# Patient Record
Sex: Male | Born: 1945
Health system: Southern US, Community
[De-identification: ages and names within clinical notes are randomized; demographics above are authoritative.]

## PROBLEM LIST (undated history)

## (undated) DIAGNOSIS — I10 Essential (primary) hypertension: Secondary | ICD-10-CM

## (undated) DIAGNOSIS — N400 Enlarged prostate without lower urinary tract symptoms: Secondary | ICD-10-CM

## (undated) DIAGNOSIS — E7800 Pure hypercholesterolemia, unspecified: Secondary | ICD-10-CM

## (undated) DIAGNOSIS — K30 Functional dyspepsia: Secondary | ICD-10-CM

## (undated) HISTORY — PX: KNEE ARTHROSCOPY: SUR90

## (undated) HISTORY — PX: ROTATOR CUFF REPAIR: SHX139

## (undated) HISTORY — PX: HERNIA REPAIR: SHX51

## (undated) HISTORY — PX: COLONOSCOPY: SHX174

## (undated) HISTORY — DX: Benign prostatic hyperplasia without lower urinary tract symptoms: N40.0

## (undated) HISTORY — PX: NASAL SINUS SURGERY: SHX719

## (undated) HISTORY — PX: CATARACT EXTRACTION: SUR2

---

## 2000-10-18 ENCOUNTER — Encounter: Admission: RE | Admit: 2000-10-18 | Discharge: 2001-01-16 | Payer: Self-pay | Admitting: Family Medicine

## 2010-12-14 ENCOUNTER — Encounter (HOSPITAL_COMMUNITY): Payer: Self-pay | Admitting: Pharmacy Technician

## 2010-12-16 ENCOUNTER — Inpatient Hospital Stay (HOSPITAL_COMMUNITY)
Admission: RE | Admit: 2010-12-16 | Payer: BC Managed Care – PPO | Source: Ambulatory Visit | Admitting: Orthopedic Surgery

## 2010-12-16 ENCOUNTER — Encounter (HOSPITAL_COMMUNITY): Admission: RE | Payer: Self-pay | Source: Ambulatory Visit

## 2010-12-16 SURGERY — POSTERIOR LUMBAR FUSION 1 LEVEL
Anesthesia: General | Laterality: Right

## 2010-12-17 ENCOUNTER — Other Ambulatory Visit: Payer: Self-pay | Admitting: Orthopedic Surgery

## 2010-12-24 ENCOUNTER — Inpatient Hospital Stay (HOSPITAL_COMMUNITY): Admission: RE | Admit: 2010-12-24 | Payer: BC Managed Care – PPO | Source: Ambulatory Visit

## 2010-12-28 ENCOUNTER — Encounter (HOSPITAL_COMMUNITY): Payer: Self-pay

## 2010-12-28 ENCOUNTER — Other Ambulatory Visit: Payer: Self-pay

## 2010-12-28 ENCOUNTER — Encounter (HOSPITAL_COMMUNITY)
Admission: RE | Admit: 2010-12-28 | Discharge: 2010-12-28 | Disposition: A | Discharge status: Home / Self Care | Payer: BC Managed Care – PPO | Source: Ambulatory Visit | Attending: Orthopedic Surgery | Admitting: Orthopedic Surgery

## 2010-12-28 HISTORY — DX: Pure hypercholesterolemia, unspecified: E78.00

## 2010-12-28 HISTORY — DX: Essential (primary) hypertension: I10

## 2010-12-28 LAB — PROTIME-INR: Prothrombin Time: 13 seconds (ref 11.6–15.2)

## 2010-12-28 LAB — URINALYSIS, ROUTINE W REFLEX MICROSCOPIC
Glucose, UA: NEGATIVE mg/dL
Ketones, ur: NEGATIVE mg/dL
Leukocytes, UA: NEGATIVE
Nitrite: NEGATIVE
Protein, ur: NEGATIVE mg/dL

## 2010-12-28 LAB — CBC
HCT: 43.7 % (ref 39.0–52.0)
Hemoglobin: 15.1 g/dL (ref 13.0–17.0)
MCH: 32.7 pg (ref 26.0–34.0)
MCV: 94.6 fL (ref 78.0–100.0)
RBC: 4.62 MIL/uL (ref 4.22–5.81)

## 2010-12-28 LAB — DIFFERENTIAL
Basophils Absolute: 0 10*3/uL (ref 0.0–0.1)
Basophils Relative: 0 % (ref 0–1)
Eosinophils Relative: 0 % (ref 0–5)
Lymphocytes Relative: 22 % (ref 12–46)
Neutro Abs: 5.1 10*3/uL (ref 1.7–7.7)

## 2010-12-28 LAB — COMPREHENSIVE METABOLIC PANEL
CO2: 28 mEq/L (ref 19–32)
Calcium: 10 mg/dL (ref 8.4–10.5)
Creatinine, Ser: 0.85 mg/dL (ref 0.50–1.35)
GFR calc Af Amer: >90 mL/min (ref >90)
GFR calc non Af Amer: >90 mL/min (ref >90)
Glucose, Bld: 92 mg/dL (ref 70–99)
Total Bilirubin: 0.4 mg/dL (ref 0.3–1.2)

## 2010-12-28 LAB — SURGICAL PCR SCREEN: MRSA, PCR: NEGATIVE

## 2010-12-28 LAB — TYPE AND SCREEN
ABO/RH(D): A POS
Antibody Screen: NEGATIVE

## 2010-12-28 LAB — ABO/RH: ABO/RH(D): A POS

## 2010-12-28 NOTE — Pre-Procedure Instructions (Signed)
[address]  12/28/2010   Your procedure is scheduled on:  12/31/10  Report to [facility] at 530 AM.  Call this number if you have problems the morning of surgery: [phone]   Remember:   Do not eat food:After Midnight.  May have clear liquids: up to 4 Hours before arrival.  Clear liquids include soda, tea, black coffee, apple or grape juice, broth.  Take these medicines the morning of surgery with A SIP OF WATER: norco   Do not wear jewelry, make-up or nail polish.  Do not wear lotions, powders, or perfumes. You may wear deodorant.  Do not shave 48 hours prior to surgery.  Do not bring valuables to the hospital.  Contacts, dentures or bridgework may not be worn into surgery.  Leave suitcase in the car. After surgery it may be brought to your room.  For patients admitted to the hospital, checkout time is 11:00 AM the day of discharge.   Patients discharged the day of surgery will not be allowed to drive home.  Name and phone number of your driver: family  Special Instructions: CHG Shower Use Special Wash: 1/2 bottle night before surgery and 1/2 bottle morning of surgery.   Please read over the following fact sheets that you were given: Coughing and Deep Breathing, Blood Transfusion Information, MRSA Information and Surgical Site Infection Prevention

## 2010-12-29 NOTE — Consult Note (Signed)
Anesthesia:  Patient is a 65 year old male scheduled for a right L5-S1 transforamenal lumbar fusion on 12/31/10.  His history includes HTN, HLD, and former smoker.  Preoperative CXR showed no acute disease.  Labs are acceptable.    I was asked to review his preoperative EKG showing NSR with minimal criteria for LVH, and cannot rule out anterior infarct, age undetermined.  He also has a low R wave and inverted T wave in lead III, but this is not apparent in the other inferior leads.  Currently there are no comparison EKGs available.  No PCP is listed.  He did not report any CV symptoms at his PAT appointment. He has no history of DM or CAD according to our records.  Clinical correlation on the day of surgery, but anticipate that if he remains asymptomatic that he can proceed.

## 2010-12-30 MED ORDER — POVIDONE-IODINE 7.5 % EX SOLN
Freq: Once | CUTANEOUS | Status: DC
  Filled 2010-12-30: qty 118

## 2010-12-30 MED ORDER — CEFAZOLIN SODIUM-DEXTROSE 2-3 GM-% IV SOLR
2.0000 g | INTRAVENOUS | Status: AC
  Administered 2010-12-31: 2 g via INTRAVENOUS
  Filled 2010-12-30 (×2): qty 50

## 2010-12-31 ENCOUNTER — Inpatient Hospital Stay (HOSPITAL_COMMUNITY)
Admission: RE | Admit: 2010-12-31 | Discharge: 2011-01-03 | DRG: 756 | Disposition: A | Discharge status: Home / Self Care | Payer: BC Managed Care – PPO | Source: Ambulatory Visit | Attending: Orthopedic Surgery | Admitting: Orthopedic Surgery

## 2010-12-31 ENCOUNTER — Inpatient Hospital Stay (HOSPITAL_COMMUNITY): Payer: BC Managed Care – PPO

## 2010-12-31 ENCOUNTER — Encounter (HOSPITAL_COMMUNITY)
Admission: RE | Disposition: A | Discharge status: Home / Self Care | Payer: Self-pay | Source: Ambulatory Visit | Attending: Orthopedic Surgery

## 2010-12-31 ENCOUNTER — Encounter (HOSPITAL_COMMUNITY): Payer: Self-pay | Admitting: Vascular Surgery

## 2010-12-31 ENCOUNTER — Inpatient Hospital Stay (HOSPITAL_COMMUNITY): Payer: BC Managed Care – PPO | Admitting: Vascular Surgery

## 2010-12-31 DIAGNOSIS — Z01812 Encounter for preprocedural laboratory examination: Secondary | ICD-10-CM

## 2010-12-31 DIAGNOSIS — M48061 Spinal stenosis, lumbar region without neurogenic claudication: Principal | ICD-10-CM | POA: Diagnosis present

## 2010-12-31 DIAGNOSIS — E7800 Pure hypercholesterolemia, unspecified: Secondary | ICD-10-CM | POA: Diagnosis present

## 2010-12-31 DIAGNOSIS — Z981 Arthrodesis status: Secondary | ICD-10-CM

## 2010-12-31 DIAGNOSIS — I10 Essential (primary) hypertension: Secondary | ICD-10-CM | POA: Diagnosis present

## 2010-12-31 DIAGNOSIS — M948X9 Other specified disorders of cartilage, unspecified sites: Secondary | ICD-10-CM | POA: Diagnosis present

## 2010-12-31 SURGERY — POSTERIOR LUMBAR FUSION 1 LEVEL
Anesthesia: General | Site: Spine Lumbar | Laterality: Right | Wound class: Clean

## 2010-12-31 MED ORDER — DROPERIDOL 2.5 MG/ML IJ SOLN: 0.6250 mg | INTRAMUSCULAR | Status: DC | PRN

## 2010-12-31 MED ORDER — HEMOSTATIC AGENTS (NO CHARGE) OPTIME
TOPICAL | Status: DC | PRN
  Administered 2010-12-31 (×2): 1 via TOPICAL

## 2010-12-31 MED ORDER — FENTANYL CITRATE 0.05 MG/ML IJ SOLN
INTRAMUSCULAR | Status: DC | PRN
  Administered 2010-12-31: 100 ug via INTRAVENOUS
  Administered 2010-12-31 (×2): 50 ug via INTRAVENOUS
  Administered 2010-12-31: 100 ug via INTRAVENOUS
  Administered 2010-12-31 (×4): 50 ug via INTRAVENOUS

## 2010-12-31 MED ORDER — PROPOFOL 10 MG/ML IV EMUL
INTRAVENOUS | Status: DC | PRN
  Administered 2010-12-31: 120 ug/kg/min via INTRAVENOUS

## 2010-12-31 MED ORDER — THROMBIN 20000 UNITS EX KIT
PACK | CUTANEOUS | Status: DC | PRN
  Administered 2010-12-31: 20000 [IU] via TOPICAL

## 2010-12-31 MED ORDER — ROCURONIUM BROMIDE 100 MG/10ML IV SOLN
INTRAVENOUS | Status: DC | PRN
  Administered 2010-12-31: 10 mg via INTRAVENOUS
  Administered 2010-12-31: 50 mg via INTRAVENOUS
  Administered 2010-12-31: 20 mg via INTRAVENOUS

## 2010-12-31 MED ORDER — PHENYLEPHRINE HCL 10 MG/ML IJ SOLN
INTRAMUSCULAR | Status: DC | PRN
  Administered 2010-12-31: 40 ug via INTRAVENOUS
  Administered 2010-12-31 (×2): 80 ug via INTRAVENOUS

## 2010-12-31 MED ORDER — SIMVASTATIN 10 MG PO TABS
10.0000 mg | ORAL_TABLET | Freq: Every day | ORAL | Status: DC
  Administered 2010-12-31 – 2011-01-02 (×3): 10 mg via ORAL
  Filled 2010-12-31 (×5): qty 1

## 2010-12-31 MED ORDER — SODIUM CHLORIDE 0.9 % IJ SOLN: 9.0000 mL | INTRAMUSCULAR | Status: DC | PRN

## 2010-12-31 MED ORDER — THROMBIN 20000 UNITS EX KIT
PACK | OROMUCOSAL | Status: DC | PRN
  Administered 2010-12-31: 09:00:00 via TOPICAL

## 2010-12-31 MED ORDER — SODIUM CHLORIDE 0.9 % IJ SOLN: 3.0000 mL | INTRAMUSCULAR | Status: DC | PRN

## 2010-12-31 MED ORDER — MIDAZOLAM HCL 5 MG/5ML IJ SOLN
INTRAMUSCULAR | Status: DC | PRN
  Administered 2010-12-31: 2 mg via INTRAVENOUS

## 2010-12-31 MED ORDER — LACTATED RINGERS IV SOLN
INTRAVENOUS | Status: DC | PRN
  Administered 2010-12-31 (×4): via INTRAVENOUS

## 2010-12-31 MED ORDER — CEFAZOLIN SODIUM 1-5 GM-% IV SOLN
1.0000 g | Freq: Three times a day (TID) | INTRAVENOUS | Status: AC
  Administered 2010-12-31 (×2): 1 g via INTRAVENOUS
  Filled 2010-12-31 (×2): qty 50

## 2010-12-31 MED ORDER — SODIUM CHLORIDE 0.9 % IV SOLN: 250.0000 mL | INTRAVENOUS | Status: DC

## 2010-12-31 MED ORDER — MUPIROCIN CALCIUM 2 % EX CREA: TOPICAL_CREAM | Freq: Two times a day (BID) | CUTANEOUS | Status: DC

## 2010-12-31 MED ORDER — DIPHENHYDRAMINE HCL 50 MG/ML IJ SOLN: 12.5000 mg | Freq: Four times a day (QID) | INTRAMUSCULAR | Status: DC | PRN

## 2010-12-31 MED ORDER — MUPIROCIN 2 % EX OINT
TOPICAL_OINTMENT | CUTANEOUS | Status: AC
  Filled 2010-12-31: qty 22

## 2010-12-31 MED ORDER — MENTHOL 3 MG MT LOZG: 1.0000 | LOZENGE | OROMUCOSAL | Status: DC | PRN

## 2010-12-31 MED ORDER — MORPHINE SULFATE (PF) 1 MG/ML IV SOLN: INTRAVENOUS | Status: DC

## 2010-12-31 MED ORDER — ACETAMINOPHEN 325 MG PO TABS
650.0000 mg | ORAL_TABLET | ORAL | Status: DC | PRN
  Administered 2010-12-31 – 2011-01-01 (×2): 650 mg via ORAL
  Filled 2010-12-31 (×2): qty 2

## 2010-12-31 MED ORDER — THERA M PLUS PO TABS
1.0000 | ORAL_TABLET | Freq: Every day | ORAL | Status: DC
  Administered 2010-12-31 – 2011-01-03 (×4): 1 via ORAL
  Filled 2010-12-31 (×4): qty 1

## 2010-12-31 MED ORDER — DIAZEPAM 5 MG PO TABS
5.0000 mg | ORAL_TABLET | Freq: Four times a day (QID) | ORAL | Status: DC | PRN
  Administered 2011-01-01 – 2011-01-02 (×2): 5 mg via ORAL
  Filled 2010-12-31 (×2): qty 1

## 2010-12-31 MED ORDER — MUPIROCIN 2 % EX OINT
TOPICAL_OINTMENT | Freq: Two times a day (BID) | CUTANEOUS | Status: DC
  Administered 2010-12-31 – 2011-01-02 (×6): via NASAL

## 2010-12-31 MED ORDER — PHENYLEPHRINE HCL 10 MG/ML IJ SOLN
10.0000 mg | INTRAVENOUS | Status: DC | PRN
  Administered 2010-12-31: 50 ug/min via INTRAVENOUS

## 2010-12-31 MED ORDER — MORPHINE SULFATE 4 MG/ML IJ SOLN
2.0000 mg | INTRAMUSCULAR | Status: DC | PRN
  Administered 2010-12-31 – 2011-01-01 (×8): 2 mg via INTRAVENOUS
  Filled 2010-12-31 (×7): qty 1

## 2010-12-31 MED ORDER — PROPOFOL 10 MG/ML IV EMUL
INTRAVENOUS | Status: DC | PRN
  Administered 2010-12-31: 200 mg via INTRAVENOUS

## 2010-12-31 MED ORDER — OXYCODONE-ACETAMINOPHEN 5-325 MG PO TABS
1.0000 | ORAL_TABLET | ORAL | Status: DC | PRN
  Administered 2010-12-31 – 2011-01-01 (×6): 2 via ORAL
  Administered 2011-01-02: 1 via ORAL
  Administered 2011-01-02 – 2011-01-03 (×8): 2 via ORAL
  Filled 2010-12-31 (×15): qty 2

## 2010-12-31 MED ORDER — HETASTARCH-ELECTROLYTES 6 % IV SOLN
INTRAVENOUS | Status: DC | PRN
  Administered 2010-12-31: 10:00:00 via INTRAVENOUS

## 2010-12-31 MED ORDER — ACETAMINOPHEN 650 MG RE SUPP: 650.0000 mg | RECTAL | Status: DC | PRN

## 2010-12-31 MED ORDER — LISINOPRIL 20 MG PO TABS
20.0000 mg | ORAL_TABLET | Freq: Every day | ORAL | Status: DC
  Administered 2010-12-31 – 2011-01-03 (×3): 20 mg via ORAL
  Filled 2010-12-31 (×4): qty 1

## 2010-12-31 MED ORDER — OXYCODONE HCL 10 MG PO TB12
10.0000 mg | ORAL_TABLET | Freq: Two times a day (BID) | ORAL | Status: DC
  Administered 2010-12-31: 10 mg via ORAL
  Filled 2010-12-31: qty 1

## 2010-12-31 MED ORDER — CEFAZOLIN SODIUM 1-5 GM-% IV SOLN
INTRAVENOUS | Status: AC
  Filled 2010-12-31: qty 100

## 2010-12-31 MED ORDER — SODIUM CHLORIDE 0.9 % IJ SOLN
3.0000 mL | Freq: Two times a day (BID) | INTRAMUSCULAR | Status: DC
  Administered 2011-01-01 – 2011-01-03 (×4): 3 mL via INTRAVENOUS

## 2010-12-31 MED ORDER — BUPIVACAINE-EPINEPHRINE 0.25% -1:200000 IJ SOLN
INTRAMUSCULAR | Status: DC | PRN
  Administered 2010-12-31: 4 mL

## 2010-12-31 MED ORDER — POTASSIUM CHLORIDE IN NACL 20-0.9 MEQ/L-% IV SOLN
INTRAVENOUS | Status: DC
  Filled 2010-12-31 (×7): qty 1000

## 2010-12-31 MED ORDER — ONDANSETRON HCL 4 MG/2ML IJ SOLN: 4.0000 mg | Freq: Four times a day (QID) | INTRAMUSCULAR | Status: DC | PRN

## 2010-12-31 MED ORDER — DIPHENHYDRAMINE HCL 12.5 MG/5ML PO ELIX
12.5000 mg | ORAL_SOLUTION | Freq: Four times a day (QID) | ORAL | Status: DC | PRN
  Filled 2010-12-31: qty 5

## 2010-12-31 MED ORDER — PHENOL 1.4 % MT LIQD
1.0000 | OROMUCOSAL | Status: DC | PRN
  Filled 2010-12-31: qty 177

## 2010-12-31 MED ORDER — 0.9 % SODIUM CHLORIDE (POUR BTL) OPTIME
TOPICAL | Status: DC | PRN
  Administered 2010-12-31: 1000 mL

## 2010-12-31 MED ORDER — NALOXONE HCL 0.4 MG/ML IJ SOLN: 0.4000 mg | INTRAMUSCULAR | Status: DC | PRN

## 2010-12-31 MED ORDER — HYDROXYZINE HCL 50 MG/ML IM SOLN
50.0000 mg | INTRAMUSCULAR | Status: DC | PRN
  Filled 2010-12-31: qty 1

## 2010-12-31 MED ORDER — HYDROXYZINE HCL 50 MG PO TABS
50.0000 mg | ORAL_TABLET | ORAL | Status: DC | PRN
  Filled 2010-12-31: qty 1

## 2010-12-31 MED ORDER — HYDROMORPHONE HCL PF 1 MG/ML IJ SOLN
0.2500 mg | INTRAMUSCULAR | Status: DC | PRN
  Administered 2010-12-31: 0.25 mg via INTRAVENOUS
  Administered 2010-12-31 (×2): 0.5 mg via INTRAVENOUS
  Administered 2010-12-31: 0.25 mg via INTRAVENOUS

## 2010-12-31 MED ORDER — ZOLPIDEM TARTRATE 5 MG PO TABS
5.0000 mg | ORAL_TABLET | Freq: Every evening | ORAL | Status: DC | PRN
  Administered 2010-12-31: 5 mg via ORAL
  Filled 2010-12-31: qty 1

## 2010-12-31 SURGICAL SUPPLY — 73 items
APL SKNCLS STERI-STRIP NONHPOA (GAUZE/BANDAGES/DRESSINGS)
BENZOIN TINCTURE PRP APPL 2/3 (GAUZE/BANDAGES/DRESSINGS) IMPLANT
BLADE SURG ROTATE 9660 (MISCELLANEOUS) IMPLANT
BUR ROUND PRECISION 4.0 (BURR) ×2 IMPLANT
CARTRIDGE OIL MAESTRO DRILL (MISCELLANEOUS) IMPLANT
CLOTH BEACON ORANGE TIMEOUT ST (SAFETY) ×2 IMPLANT
CONT SPEC STER OR (MISCELLANEOUS) ×2 IMPLANT
CORDS BIPOLAR (ELECTRODE) ×2 IMPLANT
COVER SURGICAL LIGHT HANDLE (MISCELLANEOUS) ×2 IMPLANT
DIFFUSER DRILL AIR PNEUMATIC (MISCELLANEOUS) ×1 IMPLANT
DRAIN CHANNEL 15F RND FF W/TCR (WOUND CARE) IMPLANT
DRAPE C-ARM 42X72 X-RAY (DRAPES) ×2 IMPLANT
DRAPE ORTHO SPLIT 77X108 STRL (DRAPES) ×2
DRAPE POUCH INSTRU U-SHP 10X18 (DRAPES) ×2 IMPLANT
DRAPE SURG 17X23 STRL (DRAPES) ×6 IMPLANT
DRAPE SURG ORHT 6 SPLT 77X108 (DRAPES) ×1 IMPLANT
DURAPREP 26ML APPLICATOR (WOUND CARE) ×2 IMPLANT
ELECT BLADE 4.0 EZ CLEAN MEGAD (MISCELLANEOUS) ×2
ELECT CAUTERY BLADE 6.4 (BLADE) ×2 IMPLANT
ELECT REM PT RETURN 9FT ADLT (ELECTROSURGICAL) ×2
ELECTRODE BLDE 4.0 EZ CLN MEGD (MISCELLANEOUS) ×1 IMPLANT
ELECTRODE REM PT RTRN 9FT ADLT (ELECTROSURGICAL) ×1 IMPLANT
EVACUATOR SILICONE 100CC (DRAIN) IMPLANT
GAUZE SPONGE 4X4 16PLY XRAY LF (GAUZE/BANDAGES/DRESSINGS) ×6 IMPLANT
GLOVE BIO SURGEON STRL SZ8 (GLOVE) ×2 IMPLANT
GLOVE BIOGEL PI IND STRL 8 (GLOVE) ×1 IMPLANT
GLOVE BIOGEL PI INDICATOR 8 (GLOVE) ×1
GOWN PREVENTION PLUS XLARGE (GOWN DISPOSABLE) ×5 IMPLANT
GOWN STRL NON-REIN LRG LVL3 (GOWN DISPOSABLE) ×2 IMPLANT
IV CATH 14GX2 1/4 (CATHETERS) ×2 IMPLANT
KIT BASIN OR (CUSTOM PROCEDURE TRAY) ×2 IMPLANT
KIT POSITION SURG JACKSON T1 (MISCELLANEOUS) ×1 IMPLANT
KIT ROOM TURNOVER OR (KITS) ×2 IMPLANT
MARKER SKIN DUAL TIP RULER LAB (MISCELLANEOUS) ×3 IMPLANT
NDL HYPO 25GX1X1/2 BEV (NEEDLE) ×1 IMPLANT
NDL SPNL 18GX3.5 QUINCKE PK (NEEDLE) ×2 IMPLANT
NEEDLE BONE MARROW 8GX6 FENEST (NEEDLE) ×1 IMPLANT
NEEDLE HYPO 25GX1X1/2 BEV (NEEDLE) ×2 IMPLANT
NEEDLE SPNL 18GX3.5 QUINCKE PK (NEEDLE) ×4 IMPLANT
NS IRRIG 1000ML POUR BTL (IV SOLUTION) ×2 IMPLANT
OIL CARTRIDGE MAESTRO DRILL (MISCELLANEOUS) ×2
PACK LAMINECTOMY ORTHO (CUSTOM PROCEDURE TRAY) ×2 IMPLANT
PACK UNIVERSAL I (CUSTOM PROCEDURE TRAY) ×2 IMPLANT
PACK VITOSS BIOACTIVE 10CC (Neuro Prosthesis/Implant) ×1 IMPLANT
PAD ARMBOARD 7.5X6 YLW CONV (MISCELLANEOUS) ×4 IMPLANT
PATTIES SURGICAL .5 X1 (DISPOSABLE) ×2 IMPLANT
PATTIES SURGICAL .5X1.5 (GAUZE/BANDAGES/DRESSINGS) ×1 IMPLANT
ROD EXPEDIUM 5.5 55MM (Rod) ×2 IMPLANT
SCREW POLY EXP 7X50MM (Screw) ×1 IMPLANT
SCREW POLYAXIAL 7X45MM (Screw) ×3 IMPLANT
SCREW SET SINGLE INNER (Screw) ×4 IMPLANT
SPACER CONCORDE CUR 5DEG L11MM (Orthopedic Implant) ×1 IMPLANT
SPONGE GAUZE 4X4 12PLY (GAUZE/BANDAGES/DRESSINGS) ×2 IMPLANT
SPONGE INTESTINAL PEANUT (DISPOSABLE) ×2 IMPLANT
SPONGE SURGIFOAM ABS GEL 100 (HEMOSTASIS) ×2 IMPLANT
STRIP CLOSURE SKIN 1/2X4 (GAUZE/BANDAGES/DRESSINGS) ×1 IMPLANT
SURGIFLO TRUKIT (HEMOSTASIS) ×1 IMPLANT
SUT MNCRL AB 3-0 PS2 18 (SUTURE) ×2 IMPLANT
SUT MNCRL AB 4-0 PS2 18 (SUTURE) ×2 IMPLANT
SUT VIC AB 0 CT1 18XCR BRD 8 (SUTURE) ×1 IMPLANT
SUT VIC AB 0 CT1 8-18 (SUTURE) ×2
SUT VIC AB 1 CT1 18XCR BRD 8 (SUTURE) ×2 IMPLANT
SUT VIC AB 1 CT1 8-18 (SUTURE) ×2
SUT VIC AB 2-0 CT2 18 VCP726D (SUTURE) ×2 IMPLANT
SYR 20CC LL (SYRINGE) ×2 IMPLANT
SYR BULB IRRIGATION 50ML (SYRINGE) ×2 IMPLANT
SYR CONTROL 10ML LL (SYRINGE) ×3 IMPLANT
TAPE CLOTH SURG 4X10 WHT LF (GAUZE/BANDAGES/DRESSINGS) ×1 IMPLANT
TOWEL OR 17X24 6PK STRL BLUE (TOWEL DISPOSABLE) ×2 IMPLANT
TOWEL OR 17X26 10 PK STRL BLUE (TOWEL DISPOSABLE) ×2 IMPLANT
TRAY FOLEY CATH 14FR (SET/KITS/TRAYS/PACK) ×2 IMPLANT
WATER STERILE IRR 1000ML POUR (IV SOLUTION) ×2 IMPLANT
YANKAUER SUCT BULB TIP NO VENT (SUCTIONS) ×2 IMPLANT

## 2010-12-31 NOTE — Progress Notes (Signed)
Muperocin ointment given this am.  Pt will need one more dose

## 2010-12-31 NOTE — Preoperative (Signed)
Beta Blockers   Reason not to administer Beta Blockers:Pt does not take B Blockers 

## 2010-12-31 NOTE — Anesthesia Preprocedure Evaluation (Addendum)
Anesthesia Evaluation  Patient identified by MRN, date of birth, ID band Patient awake    Reviewed: Allergy & Precautions, H&P , NPO status , Patient's Chart, lab work & pertinent test results, reviewed documented beta blocker date and time   Airway Mallampati: II TM Distance: >3 FB Neck ROM: Full    Dental  (+) Teeth Intact   Pulmonary  clear to auscultation        Cardiovascular hypertension, On Medications regular Normal- Systolic murmurs    Neuro/Psych Negative Neurological ROS     GI/Hepatic negative GI ROS, Neg liver ROS,   Endo/Other    Renal/GU      Musculoskeletal   Abdominal   Peds  Hematology   Anesthesia Other Findings   Reproductive/Obstetrics                          Anesthesia Physical Anesthesia Plan  ASA: II  Anesthesia Plan:    Post-op Pain Management:    Induction:   Airway Management Planned:   Additional Equipment:   Intra-op Plan:   Post-operative Plan:   Informed Consent: I have reviewed the patients History and Physical, chart, labs and discussed the procedure including the risks, benefits and alternatives for the proposed anesthesia with the patient or authorized representative who has indicated his/her understanding and acceptance.     Plan Discussed with: Anesthesiologist and Surgeon  Anesthesia Plan Comments:        Anesthesia Quick Evaluation

## 2010-12-31 NOTE — H&P (Signed)
PREOPERATIVE H&P  Chief Complaint: Right leg pain  HPI: Michael Osborn is a 65 y.o. male who presents with right leg pain  Past Medical History  Diagnosis Date  . High cholesterol   . Hypertension     DR ROSS-NO CURRENT CARDIAC TESTS   Past Surgical History  Procedure Date  . Cataract extraction   . Knee arthroscopy   . Nasal sinus surgery   . Rotator cuff repair rt   History   Social History  . Marital Status: Married    Spouse Name: N/A    Number of Children: N/A  . Years of Education: N/A   Social History Main Topics  . Smoking status: Former Smoker    Quit date: 12/28/2003  . Smokeless tobacco: Not on file  . Alcohol Use: Yes     daily  . Drug Use: No  . Sexually Active:    Other Topics Concern  . Not on file   Social History Narrative  . No narrative on file   No family history on file. No Known Allergies Prior to Admission medications   Medication Sig Start Date End Date Taking? Authorizing Provider  fish oil-omega-3 fatty acids 1000 MG capsule Take 1 g by mouth daily.     Yes Historical Provider, MD  HYDROcodone-acetaminophen (NORCO) 5-325 MG per tablet Take 1-2 tablets by mouth every 6 (six) hours as needed. FOR PAIN    Yes Historical Provider, MD  lisinopril (PRINIVIL,ZESTRIL) 20 MG tablet Take 20 mg by mouth daily.     Yes Historical Provider, MD  Multiple Vitamins-Minerals (MULTIVITAMINS THER. W/MINERALS) TABS Take 1 tablet by mouth daily.     Yes Historical Provider, MD  simvastatin (ZOCOR) 10 MG tablet Take 10 mg by mouth at bedtime.     Yes Historical Provider, MD  traMADol (ULTRAM) 50 MG tablet Take 50-100 mg by mouth every 8 (eight) hours as needed. FOR PAIN; Maximum dose= 8 tablets per day    Yes Historical Provider, MD     All other systems have been reviewed and were otherwise negative with the exception of those mentioned in the HPI and as above.  Physical Exam: Filed Vitals:   12/31/10 0617  BP: 125/79  Pulse: 79  Temp: 98.7 F (37.1  C)  Resp: 16    General: Alert, no acute distress Cardiovascular: No pedal edema Respiratory: No cyanosis, no use of accessory musculature GI: No organomegaly, abdomen is soft and non-tender Skin: No lesions in the area of chief complaint Neurologic: Sensation intact distally Psychiatric: Patient is competent for consent with normal mood and affect Lymphatic: No axillary or cervical lymphadenopathy  MUSCULOSKELETAL: + SLR on right  Assessment/Plan: radiculopathy Plan for Procedure(s): POSTERIOR LUMBAR FUSION L5-S1 on right   Michael Osborn 12/31/2010 7:08 AM

## 2010-12-31 NOTE — Anesthesia Procedure Notes (Signed)
Procedure Name: Intubation Date/Time: 12/31/2010 7:32 AM Performed by: Rossie Muskrat Pre-anesthesia Checklist: Patient identified, Timeout performed, Emergency Drugs available, Patient being monitored and Suction available Patient Re-evaluated:Patient Re-evaluated prior to inductionOxygen Delivery Method: Circle System Utilized Preoxygenation: Pre-oxygenation with 100% oxygen Intubation Type: IV induction Ventilation: Mask ventilation without difficulty Laryngoscope Size: Miller and 2 Grade View: Grade II Tube type: Oral Tube size: 7.5 mm Number of attempts: 1 Airway Equipment and Method: stylet Placement Confirmation: breath sounds checked- equal and bilateral,  positive ETCO2 and ETT inserted through vocal cords under direct vision Secured at: 21 cm Tube secured with: Tape Dental Injury: Teeth and Oropharynx as per pre-operative assessment

## 2010-12-31 NOTE — Transfer of Care (Signed)
Immediate Anesthesia Transfer of Care Note  Patient: Michael Osborn  Procedure(s) Performed:  POSTERIOR LUMBAR FUSION 1 LEVEL - Right sided lumbar 5-sacrum 1 transforaminal lumbar interbody fusion with instrumentation.  Patient Location: PACU  Anesthesia Type: General  Level of Consciousness: awake, alert  and oriented  Airway & Oxygen Therapy: Patient Spontanous Breathing and Patient connected to nasal cannula oxygen  Post-op Assessment: Report given to PACU RN, Post -op Vital signs reviewed and stable and Patient moving all extremities X 4  Post vital signs: Reviewed and stable  Complications: No apparent anesthesia complications

## 2010-12-31 NOTE — Anesthesia Postprocedure Evaluation (Signed)
  Anesthesia Post-op Note  Patient: Michael Osborn  Procedure(s) Performed:  POSTERIOR LUMBAR FUSION 1 LEVEL - Right sided lumbar 5-sacrum 1 transforaminal lumbar interbody fusion with instrumentation.  Patient Location: PACU  Anesthesia Type: General  Level of Consciousness: awake and alert   Airway and Oxygen Therapy: Patient Spontanous Breathing and Patient connected to nasal cannula oxygen  Post-op Pain: mild  Post-op Assessment: Post-op Vital signs reviewed  Post-op Vital Signs: Reviewed and stable  Complications: No apparent anesthesia complications

## 2011-01-01 MED ORDER — OXYCODONE HCL 20 MG PO TB12
20.0000 mg | ORAL_TABLET | Freq: Two times a day (BID) | ORAL | Status: DC
  Administered 2011-01-01 – 2011-01-03 (×5): 20 mg via ORAL
  Filled 2011-01-01 (×5): qty 1

## 2011-01-01 MED ORDER — OXYCODONE-ACETAMINOPHEN 5-325 MG PO TABS: 1.0000 | ORAL_TABLET | ORAL | Status: AC | PRN

## 2011-01-01 MED ORDER — DIAZEPAM 5 MG PO TABS: 5.0000 mg | ORAL_TABLET | Freq: Four times a day (QID) | ORAL | Status: AC | PRN

## 2011-01-01 MED ORDER — METHOCARBAMOL 500 MG PO TABS
500.0000 mg | ORAL_TABLET | Freq: Four times a day (QID) | ORAL | Status: DC | PRN
  Administered 2011-01-01 – 2011-01-02 (×4): 500 mg via ORAL
  Filled 2011-01-01 (×4): qty 1

## 2011-01-01 MED ORDER — OXYCODONE HCL 20 MG PO TB12: 20.0000 mg | ORAL_TABLET | Freq: Two times a day (BID) | ORAL | Status: AC

## 2011-01-01 MED ORDER — SODIUM CHLORIDE 0.9 % IV BOLUS (SEPSIS)
1000.0000 mL | Freq: Once | INTRAVENOUS | Status: AC
  Administered 2011-01-01: 1000 mL via INTRAVENOUS

## 2011-01-01 MED FILL — Sodium Chloride IV Soln 0.9%: INTRAVENOUS | Qty: 1000 | Status: AC

## 2011-01-01 MED FILL — Sodium Chloride Irrigation Soln 0.9%: Qty: 3000 | Status: AC

## 2011-01-01 MED FILL — Heparin Sodium (Porcine) Inj 1000 Unit/ML: INTRAMUSCULAR | Qty: 30 | Status: AC

## 2011-01-01 NOTE — Progress Notes (Signed)
Physical Therapy Evaluation Patient Details Name: Michael Osborn MRN: [id number] DOB: 1945/06/13 Today's Date: 01/01/2011  Problem List: There is no problem list on file for this patient.   Past Medical History:  Past Medical History  Diagnosis Date  . High cholesterol   . Hypertension     DR ROSS-NO CURRENT CARDIAC TESTS   Past Surgical History:  Past Surgical History  Procedure Date  . Cataract extraction   . Knee arthroscopy   . Nasal sinus surgery   . Rotator cuff repair rt    PT Assessment/Plan/Recommendation PT Assessment Clinical Impression Statement: Pt. is POD #91following L4-5 TLIF amd is progressing well first time up.  Has mobility and gait limitations for which he needs acute followed by HHPT.  It is unclear if he will be ready for DC home tomorrow.  Will advise after PT tomorrow.  Pt. has flight of steps up tp bedroom with no first floor accomodations available. PT Recommendation/Assessment: Patient will need skilled PT in the acute care venue PT Problem List: Decreased activity tolerance;Decreased mobility;Decreased knowledge of use of DME;Decreased knowledge of precautions;Pain Barriers to Discharge: None PT Therapy Diagnosis : Difficulty walking;Acute pain PT Plan PT Frequency: Min 6X/week PT Treatment/Interventions: DME instruction;Gait training;Stair training;Functional mobility training;Therapeutic activities;Patient/family education PT Recommendation Follow Up Recommendations: Home health PT Equipment Recommended: Rolling walker with 5" wheels;3 in 1 bedside comode PT Goals  Acute Rehab PT Goals PT Goal Formulation: With patient Time For Goal Achievement: 2 days Pt will Roll Supine to Left Side: with modified independence PT Goal: Rolling Supine to Left Side - Progress: Progressing toward goal Pt will go Supine/Side to Sit: with modified independence PT Goal: Supine/Side to Sit - Progress: Progressing toward goal Pt will go Sit to Supine/Side: with  modified independence PT Goal: Sit to Supine/Side - Progress:  (not addressed) Pt will go Sit to Stand: with modified independence PT Goal: Sit to Stand - Progress: Progressing toward goal Pt will go Stand to Sit: with modified independence PT Goal: Stand to Sit - Progress: Progressing toward goal Pt will Ambulate: 51 - 150 feet;with modified independence;with rolling walker PT Goal: Ambulate - Progress: Progressing toward goal Additional Goals Additional Goal #1: Pt. will state and observe 3/3 back precautions PT Goal: Additional Goal #1 - Progress: Progressing toward goal  PT Evaluation Precautions/Restrictions  Precautions Precautions: Back Precaution Booklet Issued: Yes (comment) (handout provided and reviewed with pt.) Required Braces or Orthoses: No Prior Functioning  Home Living Lives With: Spouse;Daughter Receives Help From: Family Type of Home: House Home Layout: Two level;Bed/bath upstairs Alternate Level Stairs-Rails: Left Alternate Level Stairs-Number of Steps: 12 Home Access: Stairs to enter Entrance Stairs-Rails: None Entrance Stairs-Number of Steps: 1 Bathroom Shower/Tub: Psychologist, counselling;Door [employer] Toilet: Standard Home Adaptive Equipment: None Prior Function Level of Independence: Independent with basic ADLs;Independent with gait;Independent with transfers Driving: Yes 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 Arousal/Alertness: Awake/alert Overall Cognitive Status: Appears within functional limits for tasks assessed Orientation Level: Oriented X4 Sensation/Coordination Sensation Light Touch: Appears Intact (legs) Extremity Assessment RLE Assessment RLE Assessment: Within Functional Limits LLE Assessment LLE Assessment: Within Functional Limits Mobility (including Balance) Bed Mobility Bed Mobility: Yes Rolling Left: 4: Min assist Rolling Left Details (indicate cue type and reason): vc's for log rolling  technique/spinal alignment Left Sidelying to Sit: 3: Mod assist Left Sidelying to Sit Details (indicate cue type and reason): vc's for use of upper body to assist self to upright Sit to Supine - Left: Not  tested (comment) Transfers Transfers: Yes Sit to Stand: 1: +2 Total assist;Patient percentage (comment) (pt. 60%) Sit to Stand Details (indicate cue type and reason): vc's to straighten knees to achieve standing and keep spine as straight as possible Stand to Sit: 4: Min assist Stand to Sit Details: technique and back precaution cues Ambulation/Gait Ambulation/Gait: Yes Ambulation/Gait Assistance: 4: Min assist (second person to push recliner behind pt) Ambulation/Gait Assistance Details (indicate cue type and reason): vc's for erect spine Ambulation Distance (Feet): 120 Feet Assistive device: Rolling walker Gait Pattern:  (gait stiff but otherwise WFL) Gait velocity: decreased Stairs: No    Exercise    End of Session PT - End of Session Equipment Utilized During Treatment: Gait belt Activity Tolerance: Patient tolerated treatment well;Patient limited by pain Patient left: in chair;with call bell in reach;with family/visitor present (straightback chair, wife and daughter present) Nurse Communication: Mobility status for transfers;Mobility status for ambulation General Behavior During Session: [facility] for tasks performed Cognition: [facility] for tasks performed  Ferman Hamming 01/01/2011, 5:40 PM Acute Rehabilitation Services [phone] (pager)

## 2011-01-01 NOTE — Progress Notes (Signed)
Pt with resolved leg pain, + back pain as expected.  AVSS NVI Dressing CDI  POD #1 after 5/1 TLIF with resolved radicular pain  - up with PT today - oxycontin, valium, percocet, robaxin - likely d/c tomorrow vs. Sunday

## 2011-01-01 NOTE — Op Note (Signed)
Michael Osborn, Michael Osborn                ACCOUNT NO.:  [id number]  MEDICAL RECORD NO.:  [id number]  LOCATION:  5020                         FACILITY:  [facility]  PHYSICIAN:  Estill Bamberg, MD      DATE OF BIRTH:  11-13-1945  DATE OF PROCEDURE:  12/31/2010 DATE OF DISCHARGE:                              OPERATIVE REPORT   PREOPERATIVE DIAGNOSES:  Right-sided L5 radiculopathy secondary to right- sided L5-S1, neuroforaminal stenosis.  POSTOPERATIVE DIAGNOSES:  Right-sided L5 radiculopathy secondary to right-sided L5-S1, neuroforaminal stenosis.  PROCEDURES: 1. Right-sided L4-5 transforaminal lumbar interbody fusion. 2. Left-sided posterolateral fusion. 3. Placement of posterior instrumentation L5-S1. 4. Use of local autograft. 5. Intraoperative bone marrow aspiration from the patient's left iliac     crest using a separate incision. 6. Placement of interbody device x1 (11 mm Leopard lordotic cage). 7. Intraoperative use of fluoroscopy.  SURGEON:  Estill Bamberg, MD.  ASSISTANT:  Janace Litten, OPA  ANESTHESIA:  General endotracheal anesthesia.  COMPLICATIONS:  None.  DISPOSITION:  Stable.  ESTIMATED BLOOD LOSS:  500 mL.  FINDINGS: 1. Severe foraminal stenosis on the right side at the L5-S1 level. 2. Significant cavitary defect involving the posterior aspect of the     L5 vertebral body as reflected in the patient's preoperative MRI     and radiographs.  INDICATIONS FOR PROCEDURE:  Briefly, [name] is a very pleasant 65- year-old male, who presented to me with severe debilitating pain in the right leg.  The patient did go forward with extensive conservative care with no relief.  The patient did have various injections in addition to having additional forms of nonoperative measures including pain medications and therapy.  The patient continued to have ongoing debilitating pain in the right leg.  Given the patient's failure of nonoperative care, we did discuss going ahead with a  facetectomy on the right side and thoroughly decompressed the neuroforamen on the right in addition to a right-sided transforaminal lumbar interbody fusion on the left side with posterolateral fusion.  The patient fully understood the risks and limitations of the procedure as outlined in my preoperative note.  Of particular note, the patient had no improvement with epidural injections and he did understand going ahead with surgery as noted above.  It is not a guarantee of resolution of his pain.  OPERATIVE DETAILS:  On December 31, 2010, the patient was brought to Surgery and general endotracheal anesthesia was administered.  The patient was placed prone on a flat Jackson bed with a Wilson frame.  All bony prominences were meticulously padded.  Antibiotics were given and SCDs were placed.  Time-out procedure was performed.  The back was then prepped and draped in the usual sterile fashion.  I then made an incision from approximately spinous process of L4-S2 to the proximal spinous process of S1.  A lateral fluoroscopic view did confirm the appropriate operative levels.  I then subperiosteally exposed the lamina of L4 and L5 in addition to the sacral ala of the sacrum and the transverse processes of L5 bilaterally.  I then went forward with cannulating the L5 and S1 pedicles.  The pedicles were cannulated under lateral and AP fluoroscopy to confirm  the appropriate trajectory of the screws.  Of note, I did liberally use fluoroscopy placing the S1 screws to ensure that the anterior cortex of the sacrum was purchased.  I then used a ball-tip probe and a 6 mm tap and the cannulated pedicles were sealed using bone wax.  I then packed the left posterolateral gutter and turned my attention towards the patient's right side.  A facetectomy was performed using an osteotome in addition to a series of Kerrison's.  The ligamentum flavum was identified and taken down.  I then went forward with a  thorough neuroforaminal decompression on the right side, entirely removing the facet joint at the L5-S1 region.  The exiting L5 nerve was readily identified and was noted to be under undue compression.  This undue pressure was entirely alleviated at the termination of the facetectomy and foraminotomy.  I then turned my attention towards exploring the intervertebral space at the L5-S1 level.  With an assistant holding medial retraction of the traversing S1 nerve, I did use a #15 blade knife to perform an annulotomy involving the posterolateral aspect of the disk.  I did use a series of curettes, pituitaries, and did perform a thorough and complete diskectomy.  Of particular note, as reflected in the patient's preoperative x-rays and MRI, there was a rather significant cavitary defect involving the posterior aspect of the L5 vertebral body.  This did inhibit placement of interbody device at the posterior aspect of the vertebral body.  I did therefore make a decision to use a banana type interbody device which did need to be placed at the very anterior aspect of the L5-S1 interspace, in order to stay clear of the cavitary defect that was noted in the patient's preoperative imaging.  I did feel that an 11 mm trial would be the most appropriate fit.  I then made a separate incision over the patient's left iliac crest and a total of 8 mL of bone marrow aspirate was obtained from the patient's left iliac crest.  This was mixed with 10 mL of Vitoss BA and the autograft obtained from the decompression was mixed with the Vitoss and bone marrow aspirate.  The intervertebral space was then liberally packed with the bone graft mixture anteriorly and 11 mm Leopard lordotic cage was then packed with the bone graft mixture and tamped into position in the usual fashion. Again, I did purposefully place the intervertebral device at the very anterior aspect of the interbody space.  The posterior interbody  space was also liberally packed with abundant Vitoss in addition to autograft and bone marrow aspirate.  I was very happy with the final press fit of the implant.  I then placed 7 x 45 mm screw at L5 on the right and a 7 x 50 mm screw at S1.  I did use triggered EMG and there was no response anything less than 15 milliamps.  I then placed a 35 mm rod across the screws and caps were placed followed by a final locking procedure.  At this point, I copiously irrigated the wound with 1 L of normal saline. I then turned my attention towards the patient's left side.  The posterior elements in addition to the transverse process and sacral ala was subperiosteally exposed and was thoroughly decorticated using a 4 mm high-speed bur.  The remainder of the bone graft was placed into the posterior elements and along the posterolateral gutter.  A 7 x45 mm screws were placed at L5 and S1 and  a 35 mm rod was placed across the screws and caps were placed followed by a final locking procedure.  Of note, I did again use triggered EMG and there was no response to less than 15 milliamps.  I then explored the epidural space and all epidural bleeding was noted to be thoroughly controlled.  I then closed the fascia using #1 Vicryl.  The subcutaneous layer was closed using 2-0 Vicryl and the skin was closed using 3-0 Monocryl.  All instrument counts were correct at the termination of the procedure.  Of note, Janace Litten, was my assistant throughout the procedure and aided in essential retraction and suctioning required throughout surgery.     Estill Bamberg, MD     MD/MEDQ  D:  12/31/2010  T:  01/01/2011  Job:  [id number]

## 2011-01-02 MED ORDER — POLYETHYLENE GLYCOL 3350 17 G PO PACK
17.0000 g | PACK | Freq: Every day | ORAL | Status: DC | PRN
  Administered 2011-01-02: 17 g via ORAL
  Filled 2011-01-02: qty 1

## 2011-01-02 NOTE — Progress Notes (Signed)
Subjective: 2 Days Post-Op Procedure(s) (LRB): POSTERIOR LUMBAR FUSION 1 LEVEL (Right)  Activity level: oob Diet tolerance:eating Voiding with or without catheter:voiding Patient reports pain as 4 on 0-10 scale.    Objective: Vital signs in last 24 hours: Temp:  [98.2 F (36.8 C)-98.6 F (37 C)] 98.6 F (37 C) (12/22 0605) Pulse Rate:  [93-112] 93  (12/22 0605) Resp:  [18-20] 20  (12/22 0605) BP: (90-128)/(56-68) 90/56 mmHg (12/22 0605) SpO2:  [90 %-95 %] 90 % (12/22 0605)  Intake/Output from previous day: 12/21 0701 - 12/22 0700 In: 1550 [P.O.:600; I.V.:950] Out: 1350 [Urine:1350] Intake/Output this shift:    No results found for this basename: HGB:5 in the last 72 hours No results found for this basename: WBC:2,RBC:2,HCT:2,PLT:2 in the last 72 hours No results found for this basename: NA:2,K:2,CL:2,CO2:2,BUN:2,CREATININE:2,GLUCOSE:2,CALCIUM:2 in the last 72 hours No results found for this basename: LABPT:2,INR:2 in the last 72 hours  Neurologically intact ABD soft Neurovascular intact Sensation intact distally Intact pulses distally Dorsiflexion/Plantar flexion intact No cellulitis present  Assessment/Plan: 2 Days Post-Op Procedure(s) (LRB): POSTERIOR LUMBAR FUSION 1 LEVEL (Right) Advance diet Up with therapy Discharge home with home health  Jerris Keltz R 01/02/2011, 7:56 AM

## 2011-01-02 NOTE — Progress Notes (Signed)
Physical Therapy Treatment Patient Details Name: Michael Osborn MRN: [id number] DOB: 1945/01/16 Today's Date: 01/02/2011  PT Assessment/Plan  PT - Assessment/Plan PT Plan: Discharge plan remains appropriate PT Frequency: Min 6X/week Follow Up Recommendations: Home health PT Equipment Recommended: Rolling walker with 5" wheels;3 in 1 bedside comode PT Goals  Acute Rehab PT Goals PT Goal Formulation: With patient PT Goal: Rolling Supine to Left Side - Progress: Met PT Goal: Supine/Side to Sit - Progress: Met PT Goal: Sit to Supine/Side - Progress: Other (comment) (NT) PT Goal: Sit to Stand - Progress: Progressing toward goal PT Goal: Stand to Sit - Progress: Progressing toward goal PT Goal: Ambulate - Progress: Progressing toward goal  PT Treatment Precautions/Restrictions  Precautions Precautions: Back Precaution Booklet Issued: Yes (comment) (handout provided and reviewed with pt.) Precaution Comments: Independently verbalizes precautions Required Braces or Orthoses: No Mobility (including Balance) Bed Mobility Bed Mobility: Yes Rolling Left: 6: Modified independent (Device/Increase time) Left Sidelying to Sit: 6: Modified independent (Device/Increase time);HOB elevated (comment degrees) (20 degrees) Transfers Transfers: Yes Sit to Stand: 5: Supervision;With upper extremity assist;From bed;From chair/3-in-1;With armrests Sit to Stand Details (indicate cue type and reason): VC's to let pt know pain is normal with transers.  Increased time for transfer d/t pain Stand to Sit: 5: Supervision;With upper extremity assist;With armrests;To bed;To chair/3-in-1 Ambulation/Gait Ambulation/Gait: Yes Ambulation/Gait Assistance: 5: Supervision Ambulation/Gait Assistance Details (indicate cue type and reason): VC's to stand up straight.  Attempted several steps without walker but it was difficult for patient and he prefers to use walker Ambulation Distance (Feet): 300 Feet Assistive  device: Rolling walker;None;Other (Comment) Gait Pattern: Decreased stride length Stairs: Yes Stairs Assistance: 5: Supervision Stairs Assistance Details (indicate cue type and reason): VC's for proper technique Stair Management Technique: One rail Left;Forwards;Step to pattern Number of Stairs: 10       End of Session PT - End of Session Equipment Utilized During Treatment: Gait belt Activity Tolerance: Patient tolerated treatment well Patient left: in chair;with call bell in reach Nurse Communication: Mobility status for ambulation;Other (comment) (Pt will need RW for discharge) General Behavior During Session: [facility] for tasks performed Cognition: [facility] for tasks performed  Donnella Sham 01/02/2011, 11:46 AM  Lavona Mound, PT  [phone] 01/02/2011

## 2011-01-03 MED ORDER — FLEET ENEMA 7-19 GM/118ML RE ENEM
1.0000 | ENEMA | Freq: Once | RECTAL | Status: AC
  Administered 2011-01-03: 1 via RECTAL
  Filled 2011-01-03: qty 1

## 2011-01-03 NOTE — Progress Notes (Signed)
Subjective: 3 Days Post-Op Procedure(s) (LRB): POSTERIOR LUMBAR FUSION 1 LEVEL (Right)  Activity level: oob w/ pt Diet tolerance:eating  No bm Voiding with or without catheter:voiding Patient reports pain as 2 on 0-10 scale.    Objective: Vital signs in last 24 hours: Temp:  [98.8 F (37.1 C)-99.1 F (37.3 C)] 98.8 F (37.1 C) (12/23 0616) Pulse Rate:  [86-98] 86  (12/23 0616) Resp:  [16-18] 16  (12/23 0616) BP: (88-120)/(68-82) 120/82 mmHg (12/23 0616) SpO2:  [94 %-98 %] 98 % (12/23 0616)  Intake/Output from previous day: 12/22 0701 - 12/23 0700 In: 1620 [P.O.:1620] Out: 1000 [Urine:1000] Intake/Output this shift:    No results found for this basename: HGB:5 in the last 72 hours No results found for this basename: WBC:2,RBC:2,HCT:2,PLT:2 in the last 72 hours No results found for this basename: NA:2,K:2,CL:2,CO2:2,BUN:2,CREATININE:2,GLUCOSE:2,CALCIUM:2 in the last 72 hours No results found for this basename: LABPT:2,INR:2 in the last 72 hours  Neurologically intact ABD soft Neurovascular intact Sensation intact distally Intact pulses distally Dorsiflexion/Plantar flexion intact No cellulitis present Compartment soft  Assessment/Plan: 3 Days Post-Op Procedure(s) (LRB): POSTERIOR LUMBAR FUSION 1 LEVEL (Right) Advance diet Up with therapy Enema then d/c home post bm  Tata Timmins R 01/03/2011, 10:43 AM

## 2011-01-03 NOTE — Progress Notes (Signed)
Physical Therapy Treatment Patient Details Name: Michael Osborn MRN: [id number] DOB: [birth year] Today's Date: 01/03/2011  PT Assessment/Plan  PT - Assessment/Plan Comments on Treatment Session: good progress, pt looking forward to going home PT Plan: Discharge plan remains appropriate PT Frequency: Min 6X/week Follow Up Recommendations: Home health PT Equipment Recommended: Rolling walker with 5" wheels;3 in 1 bedside comode PT Goals  Acute Rehab PT Goals PT Goal Formulation: With patient Time For Goal Achievement: 2 days Pt will Roll Supine to Left Side: with modified independence PT Goal: Rolling Supine to Left Side - Progress: Progressing toward goal Pt will go Supine/Side to Sit: with modified independence PT Goal: Supine/Side to Sit - Progress: Progressing toward goal Pt will go Sit to Supine/Side: with modified independence PT Goal: Sit to Supine/Side - Progress: Progressing toward goal Pt will go Sit to Stand: with modified independence PT Goal: Sit to Stand - Progress: Progressing toward goal Pt will go Stand to Sit: with modified independence PT Goal: Stand to Sit - Progress: Progressing toward goal Pt will Ambulate: 51 - 150 feet;with modified independence;with rolling walker PT Goal: Ambulate - Progress: Progressing toward goal Additional Goals Additional Goal #1: Pt. will state and observe 3/3 back precautions PT Goal: Additional Goal #1 - Progress: Met  PT Treatment Precautions/Restrictions  Precautions Precautions: Back Precaution Booklet Issued: Yes (comment) (handout provided and reviewed with pt.) Precaution Comments: Independently verbalizes precautions Required Braces or Orthoses: No Restrictions Weight Bearing Restrictions: No Mobility (including Balance) Bed Mobility Bed Mobility:  (pt correctly describes technique for log roll) Transfers Sit to Stand: 5: Supervision;With upper extremity assist;From chair/3-in-1 Sit to Stand Details (indicate cue  type and reason): sloww, but steady Stand to Sit: 5: Supervision;With upper extremity assist;With armrests;To bed;To chair/3-in-1 Stand to Sit Details: good observation of back prec Ambulation/Gait Ambulation/Gait Assistance: 5: Supervision Ambulation/Gait Assistance Details (indicate cue type and reason): walked without RW and pt slow, with wide stance/base of support; smoother and less painful with RW Ambulation Distance (Feet): 200 Feet Assistive device: Rolling walker;None;Other (Comment) Gait Pattern: Decreased stride length (incr step width without RW) Gait velocity: decreased Stairs Assistance: 5: Supervision Stairs Assistance Details (indicate cue type and reason): cues to be aware of back prec Stair Management Technique: One rail Left;Forwards;Step to pattern Number of Stairs: 2     Exercise    End of Session PT - End of Session Activity Tolerance: Patient tolerated treatment well Patient left: in chair;with call bell in reach General Behavior During Session: [facility] for tasks performed Cognition: [facility] for tasks performed  Van Clines [facility] [place], [place] [phone]  01/03/2011, 10:17 AM

## 2011-01-09 NOTE — Discharge Summary (Signed)
NAMEFLOYED, Michael Osborn                ACCOUNT NO.:  [id number]  MEDICAL RECORD NO.:  [id number]  LOCATION:  5020                         FACILITY:  [facility]  PHYSICIAN:  Estill Bamberg, MD      DATE OF BIRTH:  [birth year]  DATE OF ADMISSION:  12/31/2010 DATE OF DISCHARGE:  01/03/2011                              DISCHARGE SUMMARY   ADMISSION DIAGNOSIS:  Right-sided L5 radiculopathy secondary to right- sided L5-S1 neural foraminal stenosis.  DISCHARGE DIAGNOSIS:  Right-sided L5 radiculopathy secondary to right- sided L5-S1 neural foraminal stenosis.  PROCEDURE PERFORMED:  Right-sided L4-5 transforaminal lumbar interbody fusion, performed on December 31, 2010.  ADMISSION HISTORY:  Briefly, Michael Osborn is a very pleasant 65 year old male who initially presented to me with severe and debilitating pain in the right leg.  I did review an MRI which was notable for severe foraminal stenosis on the right side at the L5-S1 level.  The patient did go forward with conservative care, but did ultimately fail multiple forms of conservative care.  We therefore did have a discussion regarding going forward with the right-sided L5-S1 transforaminal lumbar interbody fusion.  HOSPITAL COURSE:  On December 31, 2010, the patient was brought to surgery and underwent the procedure noted above.  The patient tolerated the procedure well and was transferred to recovery in stable condition. The patient was evaluated by me in the morning of postop day #1.  The patient's right-sided radicular pain was noted to be entirely resolved on the morning of postop day #1.  The patient did have minimal back discomfort.  The patient did have a PCA and Foley which was discontinued on the morning of postop day #1.  The patient was progressively mobilized throughout his hospital stay with the Physical Therapy team. The patient was educated on back precautions.  It was ultimately on the morning of postop day #3 where it was felt by  the Physical Therapy team that the patient would be safe for discharge home.  The patient was noted to be independent in all activities of daily living and his pain was noted to be minimal.  The patient was uneventfully discharged on the morning of postop day #3.  DISCHARGE INSTRUCTIONS:  The patient will take Percocet for pain and Valium for spasms.  The patient will adhere to back precautions at all times.  The patient will follow up in my office in approximately 2 weeks after his procedure.  The patient was educated on signs and symptoms of infection and the patient will contact my office if any of those signs or symptoms should acquire themselves.     Estill Bamberg, MD     MD/MEDQ  D:  01/09/2011  T:  01/09/2011  Job:  [id number]

## 2012-10-27 ENCOUNTER — Ambulatory Visit (INDEPENDENT_AMBULATORY_CARE_PROVIDER_SITE_OTHER): Payer: Medicare Other | Admitting: General Surgery

## 2012-10-27 ENCOUNTER — Encounter (INDEPENDENT_AMBULATORY_CARE_PROVIDER_SITE_OTHER): Payer: Self-pay | Admitting: General Surgery

## 2012-10-27 ENCOUNTER — Other Ambulatory Visit (INDEPENDENT_AMBULATORY_CARE_PROVIDER_SITE_OTHER): Payer: Self-pay | Admitting: General Surgery

## 2012-10-27 VITALS — BP 160/80 | HR 92 | Temp 97.6°F | Resp 15 | Ht 69.0 in | Wt 185.6 lb

## 2012-10-27 DIAGNOSIS — K4090 Unilateral inguinal hernia, without obstruction or gangrene, not specified as recurrent: Secondary | ICD-10-CM

## 2012-10-27 MED ORDER — TAMSULOSIN HCL 0.4 MG PO CAPS: 0.4000 mg | ORAL_CAPSULE | Freq: Every day | ORAL | Status: DC

## 2012-10-27 NOTE — Progress Notes (Addendum)
Patient ID: Michael Osborn, male   DOB: [birth year], 67 y.o.   MRN: [id number]  Chief Complaint  Patient presents with  . New Evaluation    eval RIH    HPI Michael Osborn is a 67 y.o. male.   HPI  He is referred by Dr. Vernie Osborn for evaluation of a right inguinal hernia.  He has noted a bulge for about 10 months. It becomes uncomfortable with certain activities. He can push on it and it goes away.  He does have some difficulty with voiding at times and has nocturia. No difficulty with constipation.  Past Medical History  Diagnosis Date  . High cholesterol   . Hypertension     DR ROSS-NO CURRENT CARDIAC TESTS  . BPH (benign prostatic hyperplasia)     Past Surgical History  Procedure Laterality Date  . Cataract extraction    . Knee arthroscopy    . Nasal sinus surgery    . Rotator cuff repair  rt    Family History  Problem Relation Age of Onset  . Stroke Father     Social History History  Substance Use Topics  . Smoking status: Former Smoker    Quit date: 12/28/2003  . Smokeless tobacco: Never Used  . Alcohol Use: Yes     Comment: daily    No Known Allergies  Current Outpatient Prescriptions  Medication Sig Dispense Refill  . atorvastatin (LIPITOR) 40 MG tablet       . lisinopril (PRINIVIL,ZESTRIL) 20 MG tablet Take 20 mg by mouth daily.        . Multiple Vitamins-Minerals (MULTIVITAMINS THER. W/MINERALS) TABS Take 1 tablet by mouth daily.        . traMADol (ULTRAM) 50 MG tablet        No current facility-administered medications for this visit.    Review of Systems Review of Systems  Constitutional: Negative.   HENT: Positive for congestion.   Respiratory: Negative.   Cardiovascular: Negative.   Gastrointestinal: Negative.   Genitourinary: Positive for difficulty urinating.  Musculoskeletal: Negative.   Neurological: Negative.   Hematological: Negative.     Blood pressure 160/80, pulse 92, temperature 97.6 F (36.4 C), temperature source Temporal, resp.  rate 15, height 5\' 9"  (1.753 m), weight 185 lb 9.6 oz (84.188 kg).  Physical Exam Physical Exam  Constitutional: He appears well-developed and well-nourished. No distress.  HENT:  Head: Normocephalic and atraumatic.  Abdominal: Soft. He exhibits no distension.  No periumbilical bulge.  Genitourinary:  Reducible right inguinal bulge. No left inguinal bulge.  Neurological: He is alert.  Skin: Skin is warm and dry.  Psychiatric: He has a normal mood and affect. His behavior is normal.    Data Reviewed Notes from Dr. Vernie Osborn  Assessment    Symptomatic right inguinal hernia. He is interested in repair. We discussed open technique with On-Q pump and laparoscopic technique. He has chosen open repair.     Plan    Right inguinal hernia repair with mesh and placement of On-Q pump.   I have explained the procedure, risks, and aftercare of inguinal hernia repair.  Risks include but are not limited to bleeding, infection, wound problems, anesthesia, recurrence, bladder or intestine injury, urinary retention, testicular dysfunction, chronic pain, mesh problems.  His biggest risk factor is postoperative urinary retention. I will discuss starting him on Flomax perioperatively with Dr. Vernie Osborn.  We will send him to scheduling today.    I spoke with Dr. Vernie Osborn. He agreed with treating him with  Flomax perioperatively. I left a message for Michael Osborn to call me back so we could discuss this.    Brixton Schnapp J 10/27/2012, 2:18 PM

## 2012-10-27 NOTE — Patient Instructions (Signed)
[facility] _______[facility]  UMBILICAL OR INGUINAL HERNIA REPAIR: POST OP INSTRUCTIONS  Always review your discharge instruction sheet given to you by the facility where your surgery was performed. IF YOU HAVE DISABILITY OR FAMILY LEAVE FORMS, YOU MUST BRING THEM TO THE OFFICE FOR PROCESSING.   DO NOT GIVE THEM TO YOUR DOCTOR.  1. A  prescription for pain medication may be given to you upon discharge.  Take your pain medication as prescribed, if needed.  If narcotic pain medicine is not needed, then you may take acetaminophen (Tylenol) or ibuprofen (Advil) as needed. 2. Take your usually prescribed medications unless otherwise directed. 3. If you need a refill on your pain medication, please contact your pharmacy.  They will contact our office to request authorization. Prescriptions will not be filled after 5 pm or on week-ends. 4. You should follow a light diet the first 24 hours after arrival home, such as soup and crackers, etc.  Be sure to include lots of fluids daily.  Resume your normal diet the day after surgery. 5. Most patients will experience some swelling and bruising around the umbilicus or in the groin and scrotum.  Ice packs and reclining will help.  Swelling and bruising can take several days to resolve.  6. It is common to experience some constipation if taking pain medication after surgery.  Increasing fluid intake and taking a stool softener (such as Colace) will usually help or prevent this problem from occurring.  A mild laxative (Milk of Magnesia or Miralax) should be taken according to package directions if there are no bowel movements after 48 hours. 7. Unless discharge instructions indicate otherwise, you may remove your bandages 24-48 hours after surgery, and you may shower at that time.  You may have steri-strips (small skin tapes) in place directly over the incision.  These strips should be left on the skin for 7-10 days.  If your surgeon used skin glue on the  incision, you may shower in 24 hours.  The glue will flake off over the next 2-3 weeks.  Any sutures or staples will be removed at the office during your follow-up visit. 8. ACTIVITIES:  You may resume regular (light) daily activities beginning the next day--such as daily self-care, walking, climbing stairs--gradually increasing activities as tolerated.  You may have sexual intercourse when it is comfortable.  Refrain from any heavy lifting or straining until approved by your doctor. a. You may drive when you are no longer taking prescription pain medication, you can comfortably wear a seatbelt, and you can safely maneuver your car and apply brakes. b. RETURN TO WORK:  __________________________________________________________ 9. You should see your doctor in the office for a follow-up appointment approximately 2-3 weeks after your surgery.  Make sure that you call for this appointment within a day or two after you arrive home to insure a convenient appointment time. 10. OTHER INSTRUCTIONS:  __________________________________________________________________________________________________________________________________________________________________________________________  WHEN TO CALL YOUR DOCTOR: 1. Fever over 101.0 2. Inability to urinate 3. Nausea and/or vomiting 4. Extreme swelling or bruising 5. Continued bleeding from incision. 6. Increased pain, redness, or drainage from the incision  The clinic staff is available to answer your questions during regular business hours.  Please don't hesitate to call and ask to speak to one of the nurses for clinical concerns.  If you have a medical emergency, go to the nearest emergency room or call 911.  A surgeon from [facility] is always on call at the hospital     [address], [address], [place], [place]  [address] ?  [address] [address], , [place]   [address] [phone] ? [phone] ? FAX [phone] Web site:  [email or web address]  

## 2012-10-30 ENCOUNTER — Telehealth (INDEPENDENT_AMBULATORY_CARE_PROVIDER_SITE_OTHER): Payer: Self-pay

## 2012-10-30 NOTE — Telephone Encounter (Signed)
I tried to send a prescription for this to his pharmacy.  Please have him check his pharmacy and have him call us to confirm the prescription.  He needs to take one pill per day beginning 4 days before surgery, 2 pills the evening of surgery, then one pill a day after that until they are gone.

## 2012-10-30 NOTE — Telephone Encounter (Signed)
Pt returning Dr. Maris Berger call.  Let pt know that per Dr. Maris Berger last office visit note that he spoke with Dr. Vernie Ammons about the Flomax and both Dr's agree that the pt should start the regimen but that I did not have any other information.  Pt request a call back either from Dr. Abbey Chatters or Cyndra Numbers.  Let pt know that I will send a message to both.

## 2012-10-31 ENCOUNTER — Telehealth (INDEPENDENT_AMBULATORY_CARE_PROVIDER_SITE_OTHER): Payer: Self-pay

## 2012-10-31 NOTE — Telephone Encounter (Signed)
Pt picked up the Rx from his pharmacy but states he "does not know where he put it".  I told him if he could not find it we would call in another for him.  Instructions for taking the Flomax were reviewed with the patient and he voiced understanding.

## 2012-10-31 NOTE — Telephone Encounter (Signed)
No notes in encounter. 

## 2012-11-27 ENCOUNTER — Other Ambulatory Visit (INDEPENDENT_AMBULATORY_CARE_PROVIDER_SITE_OTHER): Payer: Self-pay | Admitting: General Surgery

## 2012-11-27 ENCOUNTER — Ambulatory Visit
Admission: RE | Admit: 2012-11-27 | Discharge: 2012-11-27 | Disposition: A | Discharge status: Home / Self Care | Payer: BC Managed Care – PPO | Source: Ambulatory Visit | Attending: General Surgery | Admitting: General Surgery

## 2012-11-27 DIAGNOSIS — K4090 Unilateral inguinal hernia, without obstruction or gangrene, not specified as recurrent: Secondary | ICD-10-CM

## 2012-12-11 ENCOUNTER — Other Ambulatory Visit (INDEPENDENT_AMBULATORY_CARE_PROVIDER_SITE_OTHER): Payer: Self-pay | Admitting: *Deleted

## 2012-12-11 DIAGNOSIS — K4090 Unilateral inguinal hernia, without obstruction or gangrene, not specified as recurrent: Secondary | ICD-10-CM

## 2012-12-11 MED ORDER — OXYCODONE HCL 5 MG PO TABS: 5.0000 mg | ORAL_TABLET | ORAL | Status: DC | PRN

## 2012-12-12 ENCOUNTER — Telehealth (INDEPENDENT_AMBULATORY_CARE_PROVIDER_SITE_OTHER): Payer: Self-pay | Admitting: *Deleted

## 2012-12-12 ENCOUNTER — Ambulatory Visit (INDEPENDENT_AMBULATORY_CARE_PROVIDER_SITE_OTHER): Payer: Medicare Other | Admitting: General Surgery

## 2012-12-12 ENCOUNTER — Encounter (INDEPENDENT_AMBULATORY_CARE_PROVIDER_SITE_OTHER): Payer: Medicare Other

## 2012-12-12 VITALS — BP 120/68 | HR 100 | Temp 97.5°F | Resp 18

## 2012-12-12 DIAGNOSIS — Z4889 Encounter for other specified surgical aftercare: Secondary | ICD-10-CM

## 2012-12-12 MED ORDER — OXYCODONE HCL 5 MG PO TABS: 5.0000 mg | ORAL_TABLET | ORAL | Status: DC | PRN

## 2012-12-12 NOTE — Telephone Encounter (Signed)
Dr. Abbey Chatters called office to state that he can see the patient himself today in office between 130-2p.  Called patient who states he will be here at 145p.

## 2012-12-12 NOTE — Patient Instructions (Addendum)
Remove bandage in 2 days. May shower at that time. Continue the ice pack. Call if he you have heavy bleeding.

## 2012-12-12 NOTE — Progress Notes (Signed)
Procedure:  Right inguinal hernia repair with mesh and placement of On-Q pump  Date:  12/11/2012  Pathology:  na  History:  He called because she's been having some bloody drainage coming from underneath the dressing and he's not getting good pain control. It sounded like he may have leaking from the on cue pump site. He is having a lot of soreness and some bruising in the scrotal area.  Exam: General- Is in NAD. Right groin-there is some thin bloody drainage on the bandage. I removed a bandage. Incision is clean and is not bleeding. However there is thin reddish drainage coming from the site around the angiocatheter. I removed the catheter. I held direct pressure here. There is no active bleeding. A bulky dressing was applied.  Assessment:  Status post right inguinal hernia repair with drainage On Q pump catheter site. Catheter has been removed.  Plan:  Ice to the area. I refilled his OxyIR as he'll probably have more pain now given that the pump had not worked. Continue light activities. Return visit 2 weeks.

## 2012-12-12 NOTE — Telephone Encounter (Signed)
Patient called this morning to report excessive bleeding coming from dressing and running out.  Patient states he is having trouble placing ice due to Q-pump.  Patient states the dressing is not really staying in place due to the bleeding.  Nurse only appt made for 230p this afternoon so if it is more than nurse can do or concerns then urgent office MD can be called in.  Patient agreeable with plan at this time.

## 2012-12-12 NOTE — Telephone Encounter (Signed)
Agree with plan 

## 2012-12-20 ENCOUNTER — Encounter (INDEPENDENT_AMBULATORY_CARE_PROVIDER_SITE_OTHER): Payer: Medicare Other | Admitting: General Surgery

## 2012-12-27 ENCOUNTER — Ambulatory Visit (INDEPENDENT_AMBULATORY_CARE_PROVIDER_SITE_OTHER): Payer: Medicare Other | Admitting: General Surgery

## 2012-12-27 ENCOUNTER — Encounter (INDEPENDENT_AMBULATORY_CARE_PROVIDER_SITE_OTHER): Payer: Self-pay | Admitting: General Surgery

## 2012-12-27 VITALS — BP 158/90 | HR 80 | Temp 98.2°F | Resp 14 | Ht 69.0 in | Wt 183.4 lb

## 2012-12-27 DIAGNOSIS — Z4889 Encounter for other specified surgical aftercare: Secondary | ICD-10-CM

## 2012-12-27 MED ORDER — HYDROCODONE-ACETAMINOPHEN 5-325 MG PO TABS: 1.0000 | ORAL_TABLET | Freq: Four times a day (QID) | ORAL | Status: DC | PRN

## 2012-12-27 NOTE — Progress Notes (Signed)
He presents for postop followup after open Right inguinal hernia repair with mesh.  Post op pain is improving.  He is back at work.  Swelling is decreasing.  P.E.  GU:  Right groin incision clean/dry/intact, swelling is minimal, repair is solid.  Assessment:  Doing well post hernia repair.  Plan:  Norco for pain.  Continue light activities for 6 weeks postop then slowly start to resume normal activities. Return as needed.

## 2012-12-27 NOTE — Patient Instructions (Signed)
6 weeks after the date of surgery, may resume usual activities as tolerated, as we discussed.

## 2013-03-12 ENCOUNTER — Other Ambulatory Visit (HOSPITAL_COMMUNITY): Payer: Self-pay | Admitting: Orthopedic Surgery

## 2013-03-12 DIAGNOSIS — M25559 Pain in unspecified hip: Secondary | ICD-10-CM

## 2013-03-20 ENCOUNTER — Ambulatory Visit (HOSPITAL_COMMUNITY)
Admission: RE | Admit: 2013-03-20 | Discharge: 2013-03-20 | Disposition: A | Discharge status: Home / Self Care | Payer: Medicare HMO | Source: Ambulatory Visit | Attending: Orthopedic Surgery | Admitting: Orthopedic Surgery

## 2013-03-20 DIAGNOSIS — M25559 Pain in unspecified hip: Secondary | ICD-10-CM

## 2013-03-20 DIAGNOSIS — R109 Unspecified abdominal pain: Secondary | ICD-10-CM | POA: Insufficient documentation

## 2013-03-20 DIAGNOSIS — R609 Edema, unspecified: Secondary | ICD-10-CM | POA: Insufficient documentation

## 2015-01-21 DIAGNOSIS — J3081 Allergic rhinitis due to animal (cat) (dog) hair and dander: Secondary | ICD-10-CM | POA: Diagnosis not present

## 2015-01-21 DIAGNOSIS — J3089 Other allergic rhinitis: Secondary | ICD-10-CM | POA: Diagnosis not present

## 2015-01-23 DIAGNOSIS — J209 Acute bronchitis, unspecified: Secondary | ICD-10-CM | POA: Diagnosis not present

## 2015-01-24 DIAGNOSIS — M47816 Spondylosis without myelopathy or radiculopathy, lumbar region: Secondary | ICD-10-CM | POA: Diagnosis not present

## 2015-02-12 DIAGNOSIS — J3081 Allergic rhinitis due to animal (cat) (dog) hair and dander: Secondary | ICD-10-CM | POA: Diagnosis not present

## 2015-02-12 DIAGNOSIS — J3089 Other allergic rhinitis: Secondary | ICD-10-CM | POA: Diagnosis not present

## 2015-02-19 DIAGNOSIS — M545 Low back pain: Secondary | ICD-10-CM | POA: Diagnosis not present

## 2015-02-20 DIAGNOSIS — M545 Low back pain: Secondary | ICD-10-CM | POA: Diagnosis not present

## 2015-02-25 DIAGNOSIS — M545 Low back pain: Secondary | ICD-10-CM | POA: Diagnosis not present

## 2015-02-26 DIAGNOSIS — M545 Low back pain: Secondary | ICD-10-CM | POA: Diagnosis not present

## 2015-03-04 DIAGNOSIS — M545 Low back pain: Secondary | ICD-10-CM | POA: Diagnosis not present

## 2015-03-06 DIAGNOSIS — M545 Low back pain: Secondary | ICD-10-CM | POA: Diagnosis not present

## 2015-03-13 DIAGNOSIS — M545 Low back pain: Secondary | ICD-10-CM | POA: Diagnosis not present

## 2015-03-13 DIAGNOSIS — J3081 Allergic rhinitis due to animal (cat) (dog) hair and dander: Secondary | ICD-10-CM | POA: Diagnosis not present

## 2015-03-13 DIAGNOSIS — J3089 Other allergic rhinitis: Secondary | ICD-10-CM | POA: Diagnosis not present

## 2015-03-18 DIAGNOSIS — M545 Low back pain: Secondary | ICD-10-CM | POA: Diagnosis not present

## 2015-03-20 DIAGNOSIS — M545 Low back pain: Secondary | ICD-10-CM | POA: Diagnosis not present

## 2015-03-24 DIAGNOSIS — M545 Low back pain: Secondary | ICD-10-CM | POA: Diagnosis not present

## 2015-03-25 DIAGNOSIS — J3089 Other allergic rhinitis: Secondary | ICD-10-CM | POA: Diagnosis not present

## 2015-03-25 DIAGNOSIS — J3081 Allergic rhinitis due to animal (cat) (dog) hair and dander: Secondary | ICD-10-CM | POA: Diagnosis not present

## 2015-03-25 DIAGNOSIS — M545 Low back pain: Secondary | ICD-10-CM | POA: Diagnosis not present

## 2015-04-02 DIAGNOSIS — J3089 Other allergic rhinitis: Secondary | ICD-10-CM | POA: Diagnosis not present

## 2015-04-02 DIAGNOSIS — J3081 Allergic rhinitis due to animal (cat) (dog) hair and dander: Secondary | ICD-10-CM | POA: Diagnosis not present

## 2015-04-10 DIAGNOSIS — J3081 Allergic rhinitis due to animal (cat) (dog) hair and dander: Secondary | ICD-10-CM | POA: Diagnosis not present

## 2015-04-10 DIAGNOSIS — J3089 Other allergic rhinitis: Secondary | ICD-10-CM | POA: Diagnosis not present

## 2015-04-14 DIAGNOSIS — J3081 Allergic rhinitis due to animal (cat) (dog) hair and dander: Secondary | ICD-10-CM | POA: Diagnosis not present

## 2015-04-14 DIAGNOSIS — J3089 Other allergic rhinitis: Secondary | ICD-10-CM | POA: Diagnosis not present

## 2015-04-22 DIAGNOSIS — J3089 Other allergic rhinitis: Secondary | ICD-10-CM | POA: Diagnosis not present

## 2015-04-22 DIAGNOSIS — H1045 Other chronic allergic conjunctivitis: Secondary | ICD-10-CM | POA: Diagnosis not present

## 2015-04-22 DIAGNOSIS — J3081 Allergic rhinitis due to animal (cat) (dog) hair and dander: Secondary | ICD-10-CM | POA: Diagnosis not present

## 2015-05-09 DIAGNOSIS — J3081 Allergic rhinitis due to animal (cat) (dog) hair and dander: Secondary | ICD-10-CM | POA: Diagnosis not present

## 2015-05-09 DIAGNOSIS — J3089 Other allergic rhinitis: Secondary | ICD-10-CM | POA: Diagnosis not present

## 2015-05-12 DIAGNOSIS — J3081 Allergic rhinitis due to animal (cat) (dog) hair and dander: Secondary | ICD-10-CM | POA: Diagnosis not present

## 2015-05-12 DIAGNOSIS — J3089 Other allergic rhinitis: Secondary | ICD-10-CM | POA: Diagnosis not present

## 2015-05-14 DIAGNOSIS — J3089 Other allergic rhinitis: Secondary | ICD-10-CM | POA: Diagnosis not present

## 2015-05-14 DIAGNOSIS — J3081 Allergic rhinitis due to animal (cat) (dog) hair and dander: Secondary | ICD-10-CM | POA: Diagnosis not present

## 2015-05-19 DIAGNOSIS — J3089 Other allergic rhinitis: Secondary | ICD-10-CM | POA: Diagnosis not present

## 2015-05-19 DIAGNOSIS — J3081 Allergic rhinitis due to animal (cat) (dog) hair and dander: Secondary | ICD-10-CM | POA: Diagnosis not present

## 2015-05-22 DIAGNOSIS — J3089 Other allergic rhinitis: Secondary | ICD-10-CM | POA: Diagnosis not present

## 2015-05-22 DIAGNOSIS — J3081 Allergic rhinitis due to animal (cat) (dog) hair and dander: Secondary | ICD-10-CM | POA: Diagnosis not present

## 2015-05-27 DIAGNOSIS — R233 Spontaneous ecchymoses: Secondary | ICD-10-CM | POA: Diagnosis not present

## 2015-05-27 DIAGNOSIS — M5136 Other intervertebral disc degeneration, lumbar region: Secondary | ICD-10-CM | POA: Diagnosis not present

## 2015-05-27 DIAGNOSIS — E782 Mixed hyperlipidemia: Secondary | ICD-10-CM | POA: Diagnosis not present

## 2015-05-27 DIAGNOSIS — Z0000 Encounter for general adult medical examination without abnormal findings: Secondary | ICD-10-CM | POA: Diagnosis not present

## 2015-05-27 DIAGNOSIS — I10 Essential (primary) hypertension: Secondary | ICD-10-CM | POA: Diagnosis not present

## 2015-06-04 DIAGNOSIS — M75121 Complete rotator cuff tear or rupture of right shoulder, not specified as traumatic: Secondary | ICD-10-CM | POA: Diagnosis not present

## 2015-06-16 DIAGNOSIS — J3089 Other allergic rhinitis: Secondary | ICD-10-CM | POA: Diagnosis not present

## 2015-06-16 DIAGNOSIS — J3081 Allergic rhinitis due to animal (cat) (dog) hair and dander: Secondary | ICD-10-CM | POA: Diagnosis not present

## 2015-06-26 ENCOUNTER — Encounter (HOSPITAL_COMMUNITY): Payer: Self-pay | Admitting: Emergency Medicine

## 2015-06-26 DIAGNOSIS — R1013 Epigastric pain: Secondary | ICD-10-CM | POA: Diagnosis not present

## 2015-06-26 DIAGNOSIS — I10 Essential (primary) hypertension: Secondary | ICD-10-CM | POA: Diagnosis not present

## 2015-06-26 DIAGNOSIS — K5730 Diverticulosis of large intestine without perforation or abscess without bleeding: Secondary | ICD-10-CM | POA: Diagnosis not present

## 2015-06-26 DIAGNOSIS — Z87891 Personal history of nicotine dependence: Secondary | ICD-10-CM | POA: Insufficient documentation

## 2015-06-26 DIAGNOSIS — K760 Fatty (change of) liver, not elsewhere classified: Secondary | ICD-10-CM | POA: Diagnosis not present

## 2015-06-26 DIAGNOSIS — R109 Unspecified abdominal pain: Secondary | ICD-10-CM | POA: Diagnosis not present

## 2015-06-26 DIAGNOSIS — Z1211 Encounter for screening for malignant neoplasm of colon: Secondary | ICD-10-CM | POA: Diagnosis not present

## 2015-06-26 DIAGNOSIS — R112 Nausea with vomiting, unspecified: Secondary | ICD-10-CM | POA: Insufficient documentation

## 2015-06-26 DIAGNOSIS — R1010 Upper abdominal pain, unspecified: Secondary | ICD-10-CM | POA: Diagnosis not present

## 2015-06-26 DIAGNOSIS — R1084 Generalized abdominal pain: Secondary | ICD-10-CM | POA: Diagnosis not present

## 2015-06-26 LAB — COMPREHENSIVE METABOLIC PANEL
ALBUMIN: 4.2 g/dL (ref 3.5–5.0)
ALT: 36 U/L (ref 17–63)
AST: 36 U/L (ref 15–41)
Alkaline Phosphatase: 57 U/L (ref 38–126)
Anion gap: 11 (ref 5–15)
BUN: 12 mg/dL (ref 6–20)
CO2: 22 mmol/L (ref 22–32)
CREATININE: 1.05 mg/dL (ref 0.61–1.24)
Calcium: 9.7 mg/dL (ref 8.9–10.3)
Chloride: 105 mmol/L (ref 101–111)
GFR calc Af Amer: >60 mL/min (ref >60)
GFR calc non Af Amer: >60 mL/min (ref >60)
GLUCOSE: 142 mg/dL — AB (ref 65–99)
POTASSIUM: 4.1 mmol/L (ref 3.5–5.1)
Sodium: 138 mmol/L (ref 135–145)
TOTAL PROTEIN: 7.3 g/dL (ref 6.5–8.1)
Total Bilirubin: 0.9 mg/dL (ref 0.3–1.2)

## 2015-06-26 LAB — CBC
HEMATOCRIT: 46.2 % (ref 39.0–52.0)
Hemoglobin: 15.7 g/dL (ref 13.0–17.0)
MCH: 32.4 pg (ref 26.0–34.0)
MCHC: 34 g/dL (ref 30.0–36.0)
MCV: 95.3 fL (ref 78.0–100.0)
PLATELETS: 222 10*3/uL (ref 150–400)
RBC: 4.85 MIL/uL (ref 4.22–5.81)
RDW: 12.8 % (ref 11.5–15.5)
WBC: 10.2 10*3/uL (ref 4.0–10.5)

## 2015-06-26 LAB — LIPASE, BLOOD: LIPASE: 32 U/L (ref 11–51)

## 2015-06-26 MED ORDER — FENTANYL CITRATE (PF) 100 MCG/2ML IJ SOLN
50.0000 ug | INTRAMUSCULAR | Status: DC | PRN
  Administered 2015-06-26: 50 ug via NASAL
  Filled 2015-06-26: qty 2

## 2015-06-26 NOTE — ED Notes (Signed)
Pt. reports upper abdominal pain with emesis onset today after colonoscopy procedure , denies fever or chills.

## 2015-06-27 ENCOUNTER — Emergency Department (HOSPITAL_COMMUNITY): Payer: PPO

## 2015-06-27 ENCOUNTER — Emergency Department (HOSPITAL_COMMUNITY)
Admission: EM | Admit: 2015-06-27 | Discharge: 2015-06-27 | Disposition: A | Discharge status: Home / Self Care | Payer: PPO | Attending: Emergency Medicine | Admitting: Emergency Medicine

## 2015-06-27 DIAGNOSIS — R1013 Epigastric pain: Secondary | ICD-10-CM

## 2015-06-27 DIAGNOSIS — K760 Fatty (change of) liver, not elsewhere classified: Secondary | ICD-10-CM | POA: Diagnosis not present

## 2015-06-27 DIAGNOSIS — R112 Nausea with vomiting, unspecified: Secondary | ICD-10-CM

## 2015-06-27 DIAGNOSIS — R109 Unspecified abdominal pain: Secondary | ICD-10-CM | POA: Diagnosis not present

## 2015-06-27 LAB — URINALYSIS, ROUTINE W REFLEX MICROSCOPIC
Glucose, UA: NEGATIVE mg/dL
Hgb urine dipstick: NEGATIVE
KETONES UR: 15 mg/dL — AB
LEUKOCYTES UA: NEGATIVE
NITRITE: NEGATIVE
PROTEIN: NEGATIVE mg/dL
Specific Gravity, Urine: 1.025 (ref 1.005–1.030)
pH: 5 (ref 5.0–8.0)

## 2015-06-27 LAB — CBG MONITORING, ED: Glucose-Capillary: 116 mg/dL — ABNORMAL HIGH (ref 65–99)

## 2015-06-27 MED ORDER — IOPAMIDOL (ISOVUE-300) INJECTION 61%
INTRAVENOUS | Status: AC
  Administered 2015-06-27: 100 mL
  Filled 2015-06-27: qty 100

## 2015-06-27 MED ORDER — MORPHINE SULFATE (PF) 4 MG/ML IV SOLN
4.0000 mg | Freq: Once | INTRAVENOUS | Status: AC
  Administered 2015-06-27: 4 mg via INTRAVENOUS
  Filled 2015-06-27: qty 1

## 2015-06-27 MED ORDER — ONDANSETRON HCL 4 MG PO TABS: 4.0000 mg | ORAL_TABLET | Freq: Four times a day (QID) | ORAL | Status: DC

## 2015-06-27 MED ORDER — OXYCODONE-ACETAMINOPHEN 5-325 MG PO TABS: 1.0000 | ORAL_TABLET | Freq: Four times a day (QID) | ORAL | Status: DC | PRN

## 2015-06-27 MED ORDER — ONDANSETRON HCL 4 MG/2ML IJ SOLN
4.0000 mg | Freq: Once | INTRAMUSCULAR | Status: AC
  Administered 2015-06-27: 4 mg via INTRAVENOUS
  Filled 2015-06-27: qty 2

## 2015-06-27 MED ORDER — KETOROLAC TROMETHAMINE 30 MG/ML IJ SOLN
30.0000 mg | Freq: Once | INTRAMUSCULAR | Status: AC
  Administered 2015-06-27: 30 mg via INTRAVENOUS
  Filled 2015-06-27: qty 1

## 2015-06-27 MED ORDER — OXYCODONE-ACETAMINOPHEN 5-325 MG PO TABS
2.0000 | ORAL_TABLET | Freq: Once | ORAL | Status: AC
  Administered 2015-06-27: 2 via ORAL
  Filled 2015-06-27: qty 2

## 2015-06-27 MED ORDER — MORPHINE SULFATE (PF) 4 MG/ML IV SOLN
4.0000 mg | Freq: Once | INTRAVENOUS | Status: AC | PRN
  Administered 2015-06-27: 4 mg via INTRAVENOUS
  Filled 2015-06-27: qty 1

## 2015-06-27 MED ORDER — SODIUM CHLORIDE 0.9 % IV SOLN
INTRAVENOUS | Status: DC
  Administered 2015-06-27: 04:00:00 via INTRAVENOUS

## 2015-06-27 NOTE — Discharge Instructions (Signed)

## 2015-06-27 NOTE — ED Provider Notes (Signed)
CSN: [id number]     Arrival date & time 06/26/15  1956 History   First MD Initiated Contact with Patient 06/27/15 0122     Chief Complaint  Patient presents with  . Abdominal Pain     (Consider location/radiation/quality/duration/timing/severity/associated sxs/prior Treatment) HPI   This is a 70 year old Caucasian male that presents to the emergency department with upper abdominal pain after having a colonoscopy yesterday morning. Patient states that after the colonoscopy, he went home to take a nap and woke up around 3 pm with upper quadrant abdominal pain that he describes as a constant, squeezing type pain. He rates the pain at an 8/10 and denies radiation of pain to his back. Shortly after waking he began feeling nauseas and vomited several times. At around 5 p.m. he was seen at [facility] where he had an abdominal Xray. Patient was told that he needed further work-up and to come to the emergency department. Upon arrival to the ED, he was given pain medication which he states improved his pain to a 6/10. He has had no other episodes of vomiting. He denies chest pain, bright red blood in vomit, diarrhea, or fever.    Past Medical History  Diagnosis Date  . High cholesterol   . Hypertension     DR ROSS-NO CURRENT CARDIAC TESTS  . BPH (benign prostatic hyperplasia)    Past Surgical History  Procedure Laterality Date  . Cataract extraction    . Knee arthroscopy    . Nasal sinus surgery    . Rotator cuff repair  rt  . Hernia repair    . Colonoscopy     Family History  Problem Relation Age of Onset  . Stroke Father    Social History  Substance Use Topics  . Smoking status: Former Smoker    Quit date: 12/28/2003  . Smokeless tobacco: Never Used  . Alcohol Use: Yes     Comment: daily    Review of Systems  Review of Systems All other systems negative except as documented in the HPI. All pertinent positives and negatives as reviewed in the HPI.   Allergies   Review of patient's allergies indicates no known allergies.  Home Medications   Prior to Admission medications   Medication Sig Start Date End Date Taking? Authorizing Provider  atorvastatin (LIPITOR) 40 MG tablet Take 40 mg by mouth daily at 6 PM.  10/20/12  Yes Historical Provider, MD  diclofenac (VOLTAREN) 75 MG EC tablet Take 75 mg by mouth 2 (two) times daily. 06/19/15  Yes Historical Provider, MD  lisinopril (PRINIVIL,ZESTRIL) 20 MG tablet Take 20 mg by mouth daily.     Yes Historical Provider, MD  traMADol (ULTRAM) 50 MG tablet Take 50 mg by mouth every 6 (six) hours as needed for moderate pain.  09/20/12  Yes Historical Provider, MD  HYDROcodone-acetaminophen (NORCO) 5-325 MG per tablet Take 1-2 tablets by mouth every 6 (six) hours as needed. Patient not taking: Reported on 06/27/2015 12/27/12   Jackolyn Confer, MD  tamsulosin (FLOMAX) 0.4 MG CAPS capsule Take 1 capsule (0.4 mg total) by mouth daily. Patient not taking: Reported on 06/27/2015 10/27/12   Jackolyn Confer, MD   BP 175/85 mmHg  Pulse 59  Temp(Src) 97.7 F (36.5 C) (Oral)  Resp 18  SpO2 100% Physical Exam  Constitutional: He appears well-developed and well-nourished. No distress.  HENT:  Head: Normocephalic and atraumatic.  Right Ear: Tympanic membrane and ear canal normal.  Left Ear: Tympanic membrane  and ear canal normal.  Nose: Nose normal.  Mouth/Throat: Uvula is midline, oropharynx is clear and moist and mucous membranes are normal.  Eyes: Pupils are equal, round, and reactive to light.  Neck: Normal range of motion. Neck supple.  Cardiovascular: Normal rate and regular rhythm.   Pulmonary/Chest: Effort normal.  Abdominal: Soft. Bowel sounds are normal. He exhibits no shifting dullness, no distension, no fluid wave and no ascites. There is no tenderness. There is no rigidity, no rebound, no guarding and no CVA tenderness.  No signs of abdominal distention Pain is not reproducible on palpation on exam.   Musculoskeletal:  No LE swelling  Neurological: He is alert.  Acting at baseline  Skin: Skin is warm and dry. No rash noted.  Nursing note and vitals reviewed.   ED Course  Procedures (including critical care time) Labs Review Labs Reviewed  COMPREHENSIVE METABOLIC PANEL - Abnormal; Notable for the following:    Glucose, Bld 142 (*)    All other components within normal limits  URINALYSIS, ROUTINE W REFLEX MICROSCOPIC (NOT AT [facility]) - Abnormal; Notable for the following:    Bilirubin Urine SMALL (*)    Ketones, ur 15 (*)    All other components within normal limits  CBG MONITORING, ED - Abnormal; Notable for the following:    Glucose-Capillary 116 (*)    All other components within normal limits  LIPASE, BLOOD  CBC    Imaging Review Dg Abd Acute W/chest  06/27/2015  CLINICAL DATA:  Chest and abdomen pain.  Recent colonoscopy. EXAM: DG ABDOMEN ACUTE W/ 1V CHEST COMPARISON:  Chest 11/27/2012 FINDINGS: Normal heart size and pulmonary vascularity. No focal airspace disease or consolidation in the lungs. No blunting of costophrenic angles. No pneumothorax. Mediastinal contours appear intact. Calcified and tortuous aorta. Postoperative changes and degenerative changes in the right shoulder. Residual gas and stool in the colon. No small or large bowel distention. No free intra-abdominal air. No abnormal air-fluid levels. Postoperative changes in the lumbar spine. No radiopaque stones. Degenerative changes in the spine and hips. IMPRESSION: No evidence of active pulmonary disease. Nonobstructive bowel gas pattern. No free air. Electronically Signed   By: Lucienne Capers M.D.   On: 06/27/2015 02:14   I have personally reviewed and evaluated these images and lab results as part of my medical decision-making.   EKG Interpretation None      MDM   Final diagnoses:  Epigastric pain  Non-intractable vomiting with nausea, vomiting of unspecified type   Patient discussed with Dr. Stark Jock  prior to discharge. Patients CT abd and pelvis is unremarkable and does not explain patients pain. Chest xray is also unremarkable. No free air. Passed PO challenge in the ER. Due to busy department he has had a prolonged stay in the ED and is frustrated but cooperative.  Patient is nontoxic, nonseptic appearing, in no apparent distress.  Patient's pain and other symptoms adequately managed in emergency department.  Fluids given.  Labs, imaging and vitals reviewed.  Patient does not meet the SIRS or Sepsis criteria.  On repeat exam patient does not have a surgical abdomin and there are no peritoneal signs.  No indication of appendicitis, bowel obstruction, bowel perforation, cholecystitis, diverticulitis. Patient discharged home with symptomatic treatment and given strict instructions for follow-up with their primary care physician.  I have also discussed reasons to return immediately to the ER.  Patient expresses understanding and agrees with plan.      Delos Haring, PA-C 06/27/15 Delrae Rend  Nathaneil Canary  [place], MD 06/27/15 [phone]

## 2015-06-27 NOTE — ED Notes (Signed)
Cup of ice water given 

## 2015-06-30 DIAGNOSIS — K5900 Constipation, unspecified: Secondary | ICD-10-CM | POA: Diagnosis not present

## 2015-06-30 DIAGNOSIS — R109 Unspecified abdominal pain: Secondary | ICD-10-CM | POA: Diagnosis not present

## 2015-07-16 DIAGNOSIS — J301 Allergic rhinitis due to pollen: Secondary | ICD-10-CM | POA: Diagnosis not present

## 2015-07-16 DIAGNOSIS — J3081 Allergic rhinitis due to animal (cat) (dog) hair and dander: Secondary | ICD-10-CM | POA: Diagnosis not present

## 2015-07-28 DIAGNOSIS — M75121 Complete rotator cuff tear or rupture of right shoulder, not specified as traumatic: Secondary | ICD-10-CM | POA: Diagnosis not present

## 2015-07-31 DIAGNOSIS — M25511 Pain in right shoulder: Secondary | ICD-10-CM | POA: Diagnosis not present

## 2015-08-04 DIAGNOSIS — M75121 Complete rotator cuff tear or rupture of right shoulder, not specified as traumatic: Secondary | ICD-10-CM | POA: Diagnosis not present

## 2015-08-11 DIAGNOSIS — J3089 Other allergic rhinitis: Secondary | ICD-10-CM | POA: Diagnosis not present

## 2015-08-11 DIAGNOSIS — J3081 Allergic rhinitis due to animal (cat) (dog) hair and dander: Secondary | ICD-10-CM | POA: Diagnosis not present

## 2015-09-17 DIAGNOSIS — J3089 Other allergic rhinitis: Secondary | ICD-10-CM | POA: Diagnosis not present

## 2015-09-17 DIAGNOSIS — J3081 Allergic rhinitis due to animal (cat) (dog) hair and dander: Secondary | ICD-10-CM | POA: Diagnosis not present

## 2015-10-13 DIAGNOSIS — J3081 Allergic rhinitis due to animal (cat) (dog) hair and dander: Secondary | ICD-10-CM | POA: Diagnosis not present

## 2015-10-13 DIAGNOSIS — J3089 Other allergic rhinitis: Secondary | ICD-10-CM | POA: Diagnosis not present

## 2015-10-16 DIAGNOSIS — J3089 Other allergic rhinitis: Secondary | ICD-10-CM | POA: Diagnosis not present

## 2015-10-16 DIAGNOSIS — J3081 Allergic rhinitis due to animal (cat) (dog) hair and dander: Secondary | ICD-10-CM | POA: Diagnosis not present

## 2015-10-23 DIAGNOSIS — M5136 Other intervertebral disc degeneration, lumbar region: Secondary | ICD-10-CM | POA: Diagnosis not present

## 2015-10-23 DIAGNOSIS — Z23 Encounter for immunization: Secondary | ICD-10-CM | POA: Diagnosis not present

## 2015-11-10 DIAGNOSIS — J3089 Other allergic rhinitis: Secondary | ICD-10-CM | POA: Diagnosis not present

## 2015-11-10 DIAGNOSIS — J3081 Allergic rhinitis due to animal (cat) (dog) hair and dander: Secondary | ICD-10-CM | POA: Diagnosis not present

## 2015-12-15 DIAGNOSIS — J3089 Other allergic rhinitis: Secondary | ICD-10-CM | POA: Diagnosis not present

## 2015-12-15 DIAGNOSIS — J3081 Allergic rhinitis due to animal (cat) (dog) hair and dander: Secondary | ICD-10-CM | POA: Diagnosis not present

## 2015-12-19 DIAGNOSIS — J3089 Other allergic rhinitis: Secondary | ICD-10-CM | POA: Diagnosis not present

## 2015-12-19 DIAGNOSIS — J3081 Allergic rhinitis due to animal (cat) (dog) hair and dander: Secondary | ICD-10-CM | POA: Diagnosis not present

## 2015-12-22 DIAGNOSIS — J3089 Other allergic rhinitis: Secondary | ICD-10-CM | POA: Diagnosis not present

## 2015-12-22 DIAGNOSIS — J3081 Allergic rhinitis due to animal (cat) (dog) hair and dander: Secondary | ICD-10-CM | POA: Diagnosis not present

## 2015-12-25 DIAGNOSIS — J3081 Allergic rhinitis due to animal (cat) (dog) hair and dander: Secondary | ICD-10-CM | POA: Diagnosis not present

## 2015-12-25 DIAGNOSIS — J3089 Other allergic rhinitis: Secondary | ICD-10-CM | POA: Diagnosis not present

## 2015-12-29 DIAGNOSIS — J3089 Other allergic rhinitis: Secondary | ICD-10-CM | POA: Diagnosis not present

## 2015-12-29 DIAGNOSIS — J3081 Allergic rhinitis due to animal (cat) (dog) hair and dander: Secondary | ICD-10-CM | POA: Diagnosis not present

## 2016-01-14 DIAGNOSIS — J3089 Other allergic rhinitis: Secondary | ICD-10-CM | POA: Diagnosis not present

## 2016-01-14 DIAGNOSIS — J3081 Allergic rhinitis due to animal (cat) (dog) hair and dander: Secondary | ICD-10-CM | POA: Diagnosis not present

## 2016-01-22 DIAGNOSIS — R972 Elevated prostate specific antigen [PSA]: Secondary | ICD-10-CM | POA: Diagnosis not present

## 2016-01-22 DIAGNOSIS — N529 Male erectile dysfunction, unspecified: Secondary | ICD-10-CM | POA: Diagnosis not present

## 2016-01-22 DIAGNOSIS — N401 Enlarged prostate with lower urinary tract symptoms: Secondary | ICD-10-CM | POA: Diagnosis not present

## 2016-01-22 DIAGNOSIS — R3911 Hesitancy of micturition: Secondary | ICD-10-CM | POA: Diagnosis not present

## 2016-02-09 DIAGNOSIS — R21 Rash and other nonspecific skin eruption: Secondary | ICD-10-CM | POA: Diagnosis not present

## 2016-02-09 DIAGNOSIS — R0981 Nasal congestion: Secondary | ICD-10-CM | POA: Diagnosis not present

## 2016-02-09 DIAGNOSIS — M5136 Other intervertebral disc degeneration, lumbar region: Secondary | ICD-10-CM | POA: Diagnosis not present

## 2016-02-09 DIAGNOSIS — I10 Essential (primary) hypertension: Secondary | ICD-10-CM | POA: Diagnosis not present

## 2016-02-13 DIAGNOSIS — J3081 Allergic rhinitis due to animal (cat) (dog) hair and dander: Secondary | ICD-10-CM | POA: Diagnosis not present

## 2016-02-13 DIAGNOSIS — J3089 Other allergic rhinitis: Secondary | ICD-10-CM | POA: Diagnosis not present

## 2016-03-15 DIAGNOSIS — J3081 Allergic rhinitis due to animal (cat) (dog) hair and dander: Secondary | ICD-10-CM | POA: Diagnosis not present

## 2016-03-15 DIAGNOSIS — J3089 Other allergic rhinitis: Secondary | ICD-10-CM | POA: Diagnosis not present

## 2016-04-13 DIAGNOSIS — J3081 Allergic rhinitis due to animal (cat) (dog) hair and dander: Secondary | ICD-10-CM | POA: Diagnosis not present

## 2016-04-13 DIAGNOSIS — J3089 Other allergic rhinitis: Secondary | ICD-10-CM | POA: Diagnosis not present

## 2016-05-11 DIAGNOSIS — J3081 Allergic rhinitis due to animal (cat) (dog) hair and dander: Secondary | ICD-10-CM | POA: Diagnosis not present

## 2016-05-11 DIAGNOSIS — J3089 Other allergic rhinitis: Secondary | ICD-10-CM | POA: Diagnosis not present

## 2016-05-19 DIAGNOSIS — H1045 Other chronic allergic conjunctivitis: Secondary | ICD-10-CM | POA: Diagnosis not present

## 2016-05-19 DIAGNOSIS — J3081 Allergic rhinitis due to animal (cat) (dog) hair and dander: Secondary | ICD-10-CM | POA: Diagnosis not present

## 2016-05-19 DIAGNOSIS — J3089 Other allergic rhinitis: Secondary | ICD-10-CM | POA: Diagnosis not present

## 2016-05-25 DIAGNOSIS — M545 Low back pain: Secondary | ICD-10-CM | POA: Diagnosis not present

## 2016-05-25 DIAGNOSIS — M4696 Unspecified inflammatory spondylopathy, lumbar region: Secondary | ICD-10-CM | POA: Diagnosis not present

## 2016-05-31 DIAGNOSIS — M47816 Spondylosis without myelopathy or radiculopathy, lumbar region: Secondary | ICD-10-CM | POA: Diagnosis not present

## 2016-06-10 DIAGNOSIS — M47816 Spondylosis without myelopathy or radiculopathy, lumbar region: Secondary | ICD-10-CM | POA: Diagnosis not present

## 2016-06-14 DIAGNOSIS — J3081 Allergic rhinitis due to animal (cat) (dog) hair and dander: Secondary | ICD-10-CM | POA: Diagnosis not present

## 2016-06-14 DIAGNOSIS — J3089 Other allergic rhinitis: Secondary | ICD-10-CM | POA: Diagnosis not present

## 2016-07-20 DIAGNOSIS — J3081 Allergic rhinitis due to animal (cat) (dog) hair and dander: Secondary | ICD-10-CM | POA: Diagnosis not present

## 2016-07-20 DIAGNOSIS — J3089 Other allergic rhinitis: Secondary | ICD-10-CM | POA: Diagnosis not present

## 2016-08-04 DIAGNOSIS — M545 Low back pain: Secondary | ICD-10-CM | POA: Diagnosis not present

## 2016-08-04 DIAGNOSIS — I10 Essential (primary) hypertension: Secondary | ICD-10-CM | POA: Diagnosis not present

## 2016-08-04 DIAGNOSIS — E782 Mixed hyperlipidemia: Secondary | ICD-10-CM | POA: Diagnosis not present

## 2016-08-04 DIAGNOSIS — Z0000 Encounter for general adult medical examination without abnormal findings: Secondary | ICD-10-CM | POA: Diagnosis not present

## 2016-08-04 DIAGNOSIS — D485 Neoplasm of uncertain behavior of skin: Secondary | ICD-10-CM | POA: Diagnosis not present

## 2016-08-12 DIAGNOSIS — J3081 Allergic rhinitis due to animal (cat) (dog) hair and dander: Secondary | ICD-10-CM | POA: Diagnosis not present

## 2016-08-12 DIAGNOSIS — J3089 Other allergic rhinitis: Secondary | ICD-10-CM | POA: Diagnosis not present

## 2016-08-16 ENCOUNTER — Other Ambulatory Visit: Payer: Self-pay | Admitting: Family Medicine

## 2016-08-16 DIAGNOSIS — M545 Low back pain, unspecified: Secondary | ICD-10-CM

## 2016-08-27 ENCOUNTER — Other Ambulatory Visit: Payer: PPO

## 2016-09-06 ENCOUNTER — Ambulatory Visit
Admission: RE | Admit: 2016-09-06 | Discharge: 2016-09-06 | Disposition: A | Discharge status: Home / Self Care | Payer: PPO | Source: Ambulatory Visit | Attending: Family Medicine | Admitting: Family Medicine

## 2016-09-06 DIAGNOSIS — M545 Low back pain, unspecified: Secondary | ICD-10-CM

## 2016-09-06 DIAGNOSIS — M5126 Other intervertebral disc displacement, lumbar region: Secondary | ICD-10-CM | POA: Diagnosis not present

## 2016-09-15 DIAGNOSIS — J3081 Allergic rhinitis due to animal (cat) (dog) hair and dander: Secondary | ICD-10-CM | POA: Diagnosis not present

## 2016-09-15 DIAGNOSIS — J3089 Other allergic rhinitis: Secondary | ICD-10-CM | POA: Diagnosis not present

## 2016-09-20 DIAGNOSIS — M4696 Unspecified inflammatory spondylopathy, lumbar region: Secondary | ICD-10-CM | POA: Diagnosis not present

## 2016-09-24 ENCOUNTER — Other Ambulatory Visit: Payer: Self-pay | Admitting: Orthopedic Surgery

## 2016-09-24 DIAGNOSIS — M47816 Spondylosis without myelopathy or radiculopathy, lumbar region: Secondary | ICD-10-CM

## 2016-10-06 ENCOUNTER — Other Ambulatory Visit: Payer: PPO

## 2016-10-11 DIAGNOSIS — J3089 Other allergic rhinitis: Secondary | ICD-10-CM | POA: Diagnosis not present

## 2016-10-11 DIAGNOSIS — J3081 Allergic rhinitis due to animal (cat) (dog) hair and dander: Secondary | ICD-10-CM | POA: Diagnosis not present

## 2016-10-13 ENCOUNTER — Ambulatory Visit
Admission: RE | Admit: 2016-10-13 | Discharge: 2016-10-13 | Disposition: A | Discharge status: Home / Self Care | Payer: PPO | Source: Ambulatory Visit | Attending: Orthopedic Surgery | Admitting: Orthopedic Surgery

## 2016-10-13 ENCOUNTER — Other Ambulatory Visit: Payer: Self-pay | Admitting: Orthopedic Surgery

## 2016-10-13 DIAGNOSIS — M47816 Spondylosis without myelopathy or radiculopathy, lumbar region: Secondary | ICD-10-CM

## 2016-10-13 DIAGNOSIS — M545 Low back pain: Secondary | ICD-10-CM | POA: Diagnosis not present

## 2016-10-13 MED ORDER — IOPAMIDOL (ISOVUE-M 200) INJECTION 41%: 1.0000 mL | Freq: Once | INTRAMUSCULAR | Status: DC

## 2016-10-13 MED ORDER — METHYLPREDNISOLONE ACETATE 40 MG/ML INJ SUSP (RADIOLOG: 120.0000 mg | Freq: Once | INTRAMUSCULAR | Status: DC

## 2016-10-13 NOTE — Discharge Instructions (Signed)

## 2016-10-14 DIAGNOSIS — J3081 Allergic rhinitis due to animal (cat) (dog) hair and dander: Secondary | ICD-10-CM | POA: Diagnosis not present

## 2016-10-14 DIAGNOSIS — J3089 Other allergic rhinitis: Secondary | ICD-10-CM | POA: Diagnosis not present

## 2016-11-01 DIAGNOSIS — L853 Xerosis cutis: Secondary | ICD-10-CM | POA: Diagnosis not present

## 2016-11-01 DIAGNOSIS — L821 Other seborrheic keratosis: Secondary | ICD-10-CM | POA: Diagnosis not present

## 2016-11-01 DIAGNOSIS — D485 Neoplasm of uncertain behavior of skin: Secondary | ICD-10-CM | POA: Diagnosis not present

## 2016-11-10 DIAGNOSIS — J3089 Other allergic rhinitis: Secondary | ICD-10-CM | POA: Diagnosis not present

## 2016-11-10 DIAGNOSIS — J3081 Allergic rhinitis due to animal (cat) (dog) hair and dander: Secondary | ICD-10-CM | POA: Diagnosis not present

## 2016-11-16 DIAGNOSIS — J3081 Allergic rhinitis due to animal (cat) (dog) hair and dander: Secondary | ICD-10-CM | POA: Diagnosis not present

## 2016-11-16 DIAGNOSIS — J3089 Other allergic rhinitis: Secondary | ICD-10-CM | POA: Diagnosis not present

## 2016-11-23 DIAGNOSIS — J3089 Other allergic rhinitis: Secondary | ICD-10-CM | POA: Diagnosis not present

## 2016-11-23 DIAGNOSIS — J3081 Allergic rhinitis due to animal (cat) (dog) hair and dander: Secondary | ICD-10-CM | POA: Diagnosis not present

## 2016-11-26 DIAGNOSIS — J3081 Allergic rhinitis due to animal (cat) (dog) hair and dander: Secondary | ICD-10-CM | POA: Diagnosis not present

## 2016-11-26 DIAGNOSIS — J3089 Other allergic rhinitis: Secondary | ICD-10-CM | POA: Diagnosis not present

## 2016-11-29 DIAGNOSIS — J3081 Allergic rhinitis due to animal (cat) (dog) hair and dander: Secondary | ICD-10-CM | POA: Diagnosis not present

## 2016-11-29 DIAGNOSIS — J3089 Other allergic rhinitis: Secondary | ICD-10-CM | POA: Diagnosis not present

## 2016-12-17 DIAGNOSIS — J3089 Other allergic rhinitis: Secondary | ICD-10-CM | POA: Diagnosis not present

## 2016-12-17 DIAGNOSIS — J3081 Allergic rhinitis due to animal (cat) (dog) hair and dander: Secondary | ICD-10-CM | POA: Diagnosis not present

## 2017-01-10 DIAGNOSIS — J3081 Allergic rhinitis due to animal (cat) (dog) hair and dander: Secondary | ICD-10-CM | POA: Diagnosis not present

## 2017-01-10 DIAGNOSIS — J3089 Other allergic rhinitis: Secondary | ICD-10-CM | POA: Diagnosis not present

## 2017-01-14 DIAGNOSIS — Z23 Encounter for immunization: Secondary | ICD-10-CM | POA: Diagnosis not present

## 2017-01-25 DIAGNOSIS — R972 Elevated prostate specific antigen [PSA]: Secondary | ICD-10-CM | POA: Diagnosis not present

## 2017-01-25 DIAGNOSIS — N5201 Erectile dysfunction due to arterial insufficiency: Secondary | ICD-10-CM | POA: Diagnosis not present

## 2017-01-25 DIAGNOSIS — R3911 Hesitancy of micturition: Secondary | ICD-10-CM | POA: Diagnosis not present

## 2017-01-25 DIAGNOSIS — N401 Enlarged prostate with lower urinary tract symptoms: Secondary | ICD-10-CM | POA: Diagnosis not present

## 2017-01-25 DIAGNOSIS — F524 Premature ejaculation: Secondary | ICD-10-CM | POA: Diagnosis not present

## 2017-02-03 DIAGNOSIS — Z961 Presence of intraocular lens: Secondary | ICD-10-CM | POA: Diagnosis not present

## 2017-02-08 DIAGNOSIS — L400 Psoriasis vulgaris: Secondary | ICD-10-CM | POA: Diagnosis not present

## 2017-02-08 DIAGNOSIS — L304 Erythema intertrigo: Secondary | ICD-10-CM | POA: Diagnosis not present

## 2017-02-08 DIAGNOSIS — L42 Pityriasis rosea: Secondary | ICD-10-CM | POA: Diagnosis not present

## 2017-02-18 DIAGNOSIS — J3081 Allergic rhinitis due to animal (cat) (dog) hair and dander: Secondary | ICD-10-CM | POA: Diagnosis not present

## 2017-02-18 DIAGNOSIS — J3089 Other allergic rhinitis: Secondary | ICD-10-CM | POA: Diagnosis not present

## 2017-03-14 DIAGNOSIS — J3089 Other allergic rhinitis: Secondary | ICD-10-CM | POA: Diagnosis not present

## 2017-03-14 DIAGNOSIS — J3081 Allergic rhinitis due to animal (cat) (dog) hair and dander: Secondary | ICD-10-CM | POA: Diagnosis not present

## 2017-04-08 DIAGNOSIS — J3081 Allergic rhinitis due to animal (cat) (dog) hair and dander: Secondary | ICD-10-CM | POA: Diagnosis not present

## 2017-04-08 DIAGNOSIS — J3089 Other allergic rhinitis: Secondary | ICD-10-CM | POA: Diagnosis not present

## 2017-04-25 DIAGNOSIS — M545 Low back pain: Secondary | ICD-10-CM | POA: Diagnosis not present

## 2017-04-25 DIAGNOSIS — M4696 Unspecified inflammatory spondylopathy, lumbar region: Secondary | ICD-10-CM | POA: Diagnosis not present

## 2017-04-27 ENCOUNTER — Other Ambulatory Visit: Payer: Self-pay | Admitting: Orthopedic Surgery

## 2017-04-27 DIAGNOSIS — M4696 Unspecified inflammatory spondylopathy, lumbar region: Secondary | ICD-10-CM

## 2017-05-09 DIAGNOSIS — J3089 Other allergic rhinitis: Secondary | ICD-10-CM | POA: Diagnosis not present

## 2017-05-09 DIAGNOSIS — J3081 Allergic rhinitis due to animal (cat) (dog) hair and dander: Secondary | ICD-10-CM | POA: Diagnosis not present

## 2017-05-11 ENCOUNTER — Other Ambulatory Visit: Payer: PPO

## 2017-05-11 ENCOUNTER — Inpatient Hospital Stay
Admission: RE | Admit: 2017-05-11 | Discharge: 2017-05-11 | Disposition: A | Discharge status: Home / Self Care | Payer: PPO | Source: Ambulatory Visit | Attending: Orthopedic Surgery | Admitting: Orthopedic Surgery

## 2017-05-17 DIAGNOSIS — H1045 Other chronic allergic conjunctivitis: Secondary | ICD-10-CM | POA: Diagnosis not present

## 2017-05-17 DIAGNOSIS — J3089 Other allergic rhinitis: Secondary | ICD-10-CM | POA: Diagnosis not present

## 2017-05-17 DIAGNOSIS — J3081 Allergic rhinitis due to animal (cat) (dog) hair and dander: Secondary | ICD-10-CM | POA: Diagnosis not present

## 2017-05-18 ENCOUNTER — Other Ambulatory Visit: Payer: PPO

## 2017-05-20 ENCOUNTER — Other Ambulatory Visit: Payer: PPO

## 2017-05-30 ENCOUNTER — Ambulatory Visit
Admission: RE | Admit: 2017-05-30 | Discharge: 2017-05-30 | Disposition: A | Discharge status: Home / Self Care | Payer: PPO | Source: Ambulatory Visit | Attending: Orthopedic Surgery | Admitting: Orthopedic Surgery

## 2017-05-30 ENCOUNTER — Other Ambulatory Visit: Payer: Self-pay | Admitting: Orthopedic Surgery

## 2017-05-30 DIAGNOSIS — M545 Low back pain: Secondary | ICD-10-CM | POA: Diagnosis not present

## 2017-05-30 DIAGNOSIS — M4696 Unspecified inflammatory spondylopathy, lumbar region: Secondary | ICD-10-CM

## 2017-05-30 MED ORDER — METHYLPREDNISOLONE ACETATE 40 MG/ML INJ SUSP (RADIOLOG: 120.0000 mg | Freq: Once | INTRAMUSCULAR | Status: DC

## 2017-06-15 DIAGNOSIS — J3089 Other allergic rhinitis: Secondary | ICD-10-CM | POA: Diagnosis not present

## 2017-06-15 DIAGNOSIS — J3081 Allergic rhinitis due to animal (cat) (dog) hair and dander: Secondary | ICD-10-CM | POA: Diagnosis not present

## 2017-07-11 DIAGNOSIS — J3089 Other allergic rhinitis: Secondary | ICD-10-CM | POA: Diagnosis not present

## 2017-07-11 DIAGNOSIS — J3081 Allergic rhinitis due to animal (cat) (dog) hair and dander: Secondary | ICD-10-CM | POA: Diagnosis not present

## 2017-07-20 DIAGNOSIS — M4316 Spondylolisthesis, lumbar region: Secondary | ICD-10-CM | POA: Diagnosis not present

## 2017-07-20 DIAGNOSIS — M4126 Other idiopathic scoliosis, lumbar region: Secondary | ICD-10-CM | POA: Diagnosis not present

## 2017-07-20 DIAGNOSIS — M5416 Radiculopathy, lumbar region: Secondary | ICD-10-CM | POA: Diagnosis not present

## 2017-07-20 DIAGNOSIS — M545 Low back pain: Secondary | ICD-10-CM | POA: Diagnosis not present

## 2017-07-20 DIAGNOSIS — M5126 Other intervertebral disc displacement, lumbar region: Secondary | ICD-10-CM | POA: Diagnosis not present

## 2017-07-20 DIAGNOSIS — M48061 Spinal stenosis, lumbar region without neurogenic claudication: Secondary | ICD-10-CM | POA: Diagnosis not present

## 2017-07-21 ENCOUNTER — Other Ambulatory Visit: Payer: Self-pay | Admitting: Neurosurgery

## 2017-07-25 DIAGNOSIS — M5416 Radiculopathy, lumbar region: Secondary | ICD-10-CM | POA: Diagnosis not present

## 2017-07-26 DIAGNOSIS — R972 Elevated prostate specific antigen [PSA]: Secondary | ICD-10-CM | POA: Diagnosis not present

## 2017-07-28 DIAGNOSIS — M48061 Spinal stenosis, lumbar region without neurogenic claudication: Secondary | ICD-10-CM | POA: Diagnosis not present

## 2017-07-28 DIAGNOSIS — M5126 Other intervertebral disc displacement, lumbar region: Secondary | ICD-10-CM | POA: Diagnosis not present

## 2017-08-08 DIAGNOSIS — J3089 Other allergic rhinitis: Secondary | ICD-10-CM | POA: Diagnosis not present

## 2017-08-08 DIAGNOSIS — J3081 Allergic rhinitis due to animal (cat) (dog) hair and dander: Secondary | ICD-10-CM | POA: Diagnosis not present

## 2017-09-01 DIAGNOSIS — E782 Mixed hyperlipidemia: Secondary | ICD-10-CM | POA: Diagnosis not present

## 2017-09-01 DIAGNOSIS — I10 Essential (primary) hypertension: Secondary | ICD-10-CM | POA: Diagnosis not present

## 2017-09-07 DIAGNOSIS — I10 Essential (primary) hypertension: Secondary | ICD-10-CM | POA: Diagnosis not present

## 2017-09-07 DIAGNOSIS — R233 Spontaneous ecchymoses: Secondary | ICD-10-CM | POA: Diagnosis not present

## 2017-09-07 DIAGNOSIS — Z1389 Encounter for screening for other disorder: Secondary | ICD-10-CM | POA: Diagnosis not present

## 2017-09-07 DIAGNOSIS — Z0000 Encounter for general adult medical examination without abnormal findings: Secondary | ICD-10-CM | POA: Diagnosis not present

## 2017-09-07 DIAGNOSIS — E782 Mixed hyperlipidemia: Secondary | ICD-10-CM | POA: Diagnosis not present

## 2017-09-07 DIAGNOSIS — M5136 Other intervertebral disc degeneration, lumbar region: Secondary | ICD-10-CM | POA: Diagnosis not present

## 2017-09-23 DIAGNOSIS — J3081 Allergic rhinitis due to animal (cat) (dog) hair and dander: Secondary | ICD-10-CM | POA: Diagnosis not present

## 2017-09-23 DIAGNOSIS — J3089 Other allergic rhinitis: Secondary | ICD-10-CM | POA: Diagnosis not present

## 2017-09-27 NOTE — H&P (Signed)
Patient ID:   [id number] Patient: Michael Osborn  Date of Birth: [birth year] Visit Type: Office Visit   Date: 07/20/2017 11:30 AM Provider: Marchia Meiers. Vertell Limber MD   This 72 year old male presents for back pain.  HISTORY OF PRESENT ILLNESS:  1.  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  He notes low back constant with varying severity. Sitting relieves his low back pain somewhat.  He notes electric shocks into the right leg until just above the knee with certain movements, such as turning over in bed.   Patient notes he was offered stem cell injections in [place], but felt neurosurgery evaluation would be more appropriate.  Diclofenac 75 mg b.i.d. Tramadol 50 mg 0-1-2 per day  ESI's 2016, 2018 offered no relief  History:  HTN Surgical history:  Right rotator cuff and slap repair 2016, right L4-5 TLIF with L4-S1 fusion by Dr. Lynann Bologna 2012  Imaging on [place]opy          PAST MEDICAL/SURGICAL HISTORY:   (Detailed)    Disease/disorder Onset Date Management Date Comments    rotator cuff and slap repair 2016     History of lumbar fusion 2012     History of lumbar fusion  ACJ 07/20/2017 -  Hypertension         Family History:  (Detailed) Relationship Family Member Name Deceased Age at Death Condition Onset Age Cause of Death  Father  Y 83 Stroke  N     Social History:  (Detailed) Tobacco use reviewed. Preferred language is [place].   Tobacco use status: Current non-smoker. Smoking status: Never smoker.  SMOKING STATUS Type Smoking Status Usage Per Day Years Used Total Pack Years   Never smoker      VAPING USE Screened for vaping? No  ALCOHOL There is a history of alcohol use.  Type: Beer. consumed socially.     MEDICATIONS: (added, continued or stopped this visit) Started Medication Directions Instruction Stopped   atorvastatin 40 mg tablet take 1 tablet by oral route  every day     diclofenac  sodium 75 mg tablet,delayed release take 1 tablet by oral route 2 times every day     lisinopril 40 mg tablet take 1 tablet by oral route  every day     tramadol 50 mg tablet take 1 tablet by oral route  every 6 hours as needed       ALLERGIES: Ingredient Reaction Medication Name Comment  NO KNOWN ALLERGIES     No known allergies. Reviewed, no changes.   REVIEW OF SYSTEMS   See scanned patient registration form, dated, signed and dated on   Review of Systems Details System Neg/Pos Details  Constitutional Negative Chills, Fatigue, Fever, Malaise, Night sweats, Weight gain and Weight loss.  ENMT Negative Ear drainage, Hearing loss, Nasal drainage, Otalgia, Sinus pressure and Sore throat.  Eyes Negative Eye discharge, Eye pain and Vision changes.  Respiratory Negative Chronic cough, Cough, Dyspnea, Known TB exposure and Wheezing.  Cardio Negative Chest pain, Claudication, Edema and Irregular heartbeat/palpitations.  GI Negative Abdominal pain, Blood in stool, Change in stool pattern, Constipation, Decreased appetite, Diarrhea, Heartburn, Nausea and Vomiting.  GU Negative Dribbling, Dysuria, Erectile dysfunction, Hematuria, Polyuria (Genitourinary), Slow stream, Urinary frequency, Urinary incontinence and Urinary retention.  Endocrine Negative Cold intolerance, Heat intolerance, Polydipsia and Polyphagia.  Neuro Negative Dizziness, Extremity weakness, Gait disturbance, Headache, Memory impairment, Numbness in extremity, Seizures and Tremors.  Psych Negative Anxiety, Depression and  Insomnia.  Integumentary Negative Brittle hair, Brittle nails, Change in shape/size of mole(s), Hair loss, Hirsutism, Hives, Pruritus, Rash and Skin lesion.  MS Positive Back pain.  Hema/Lymph Negative Easy bleeding, Easy bruising and Lymphadenopathy.  Allergic/Immuno Negative Contact allergy, Environmental allergies, Food allergies and Seasonal allergies.  Reproductive Negative Penile discharge and Sexual  dysfunction.   PHYSICAL EXAM:   Vitals Date Temp F BP Pulse Ht In Wt Lb BMI BSA Pain Score  07/20/2017  174/85 67 68 175 26.61  4/10    PHYSICAL EXAM Details General Level of Distress: no acute distress Overall Appearance: normal  Head and Face  Right Left  Fundoscopic Exam:  normal normal    Cardiovascular Cardiac: regular rate and rhythm without murmur  Right Left  Carotid Pulses: normal normal  Respiratory Lungs: clear to auscultation  Neurological Orientation: normal Recent and Remote Memory: normal Attention Span and Concentration:   normal Language: normal Fund of Knowledge: normal  Right Left Sensation: normal normal Upper Extremity Coordination: normal normal  Lower Extremity Coordination: normal normal  Musculoskeletal Gait and Station: normal  Right Left Upper Extremity Muscle Strength: normal normal Lower Extremity Muscle Strength: normal normal Upper Extremity Muscle Tone:  normal normal Lower Extremity Muscle Tone: normal normal   Motor Strength Upper and lower extremity motor strength was tested in the clinically pertinent muscles.     Deep Tendon Reflexes  Right Left Biceps: normal normal Triceps: normal normal Brachioradialis: normal normal Patellar: normal normal Achilles: normal normal  Sensory . Any abnormal findings will be noted below.  Right Left L3: decreased    Cranial Nerves II. Optic Nerve/Visual Fields: normal III. Oculomotor: normal IV. Trochlear: normal V. Trigeminal: normal VI. Abducens: normal VII. Facial: normal VIII. Acoustic/Vestibular: normal IX. Glossopharyngeal: normal X. Vagus: normal XI. Spinal Accessory: normal XII. Hypoglossal: normal  Motor and other Tests Lhermittes: negative Rhomberg: negative Pronator drift: absent     Right Left Hoffman's: normal normal Clonus: normal normal Babinski: normal normal SLR: positive at 45 degrees  Patrick's Corky Sox): negative negative   Additional  Findings:  Upon examination, patient is able to reach to just 8 inches above floor when attempting to touch toes, patient dips equally on each leg, strength nl all extremities.    IMPRESSION:   4-view L-spine X-ray reveals, spondylosis at L4-5 and L3-4, retrolisthesis at L3-4 and L4-5, loss of lordosis in L-spine, and levo-convex scoliosis. L-spine MRI reveals right-sided disc herniation at L3-4 with foraminal stenosis. Positional testing reveals that leaning back and to the right produces severe pain that is worse than normal pain typically felt. Right-sided approach L3-4 XLIF with exploration of prior L4-5 TLIF and L3-5 pedicle screws. Recommended new L-spine MRI with and without contrast. Recommended AP + Lateral scoliosis X-rays. Upon examination, patient is able to reach to just 8 inches above floor when attempting to touch toes, patient dips equally on each leg, strength nl all extremities, right L3 decreased pin sensitivity, positive 45 degree right SLR, negative Patrick's.  PLAN:  1. Schedule L-spine MRI with and without contrast 2. Schedule AP + Lateral scoliosis X-rays 3. Nurse education 4. LSO brace fitting 5. Right-sided approach L3-4 XLIF with exploration of prior L4-5 TLIF and L3-5 pedicle screws scheduled 6. Follow-up after XLIF  Orders: Diagnostic Procedures: Assessment Procedure  M48.061 MRI Spine/lumb With & W/o Contrast  M48.061 Scoliosis- AP/Lat  M54.16 Lumbar Spine- AP/Lat  M54.16 Lumbar Spine- AP/Lat/Flex/Ex  Instruction(s)/Education: Assessment Instruction  I10 Hypertension education  [phone] Dietary management education, guidance, and counseling  Miscellaneous: Assessment   M48.061 Aspen Lo Sag Rigid Panel Quick   Completed Orders (this encounter) Order Details Reason Side Interpretation Result Initial Treatment Date Region  Lumbar Spine- AP/Lat/Flex/Ex      07/20/2017 All Levels to All Levels  Scoliosis- AP/Lat      07/20/2017 All Levels to All Levels   Hypertension education Patient to follow up with primary care provider.        Dietary management education, guidance, and counseling patient encouraged to eat a well balanced diet         Assessment/Plan   # Detail Type Description   1. Assessment Lumbar radiculopathy (M54.16).       2. Assessment Lumbar stenosis without neurogenic claudication (M48.061).   Plan Orders Aspen Lo Sag Rigid Panel Quick.       3. Assessment Essential (primary) hypertension (I10).       4. Assessment Body mass index (BMI) 26.0-26.9, adult (U38.45).   Plan Orders Today's instructions / counseling include(s) Dietary management education, guidance, and counseling. Clinical information/comments: patient encouraged to eat a well balanced diet.         Pain Management Plan Pain Scale: 4/10. Method: Numeric Pain Intensity Scale. Location: back. Onset: 07/21/2010. Duration: varies. Quality: discomforting. Pain management follow-up plan of care: Patient is taking OTC pain relievers for relief..              Provider:  Marchia Meiers. Vertell Limber MD  07/20/2017 05:15 PM Dictation edited by: Mirian Mo    CC Providers: [facility] [place],  [place]  [id number]   Meredith Mells MD  [address] [place], [place] [address]               Electronically signed by Marchia Meiers. Vertell Limber MD on 07/21/2017 06:39 PM

## 2017-09-30 ENCOUNTER — Encounter (HOSPITAL_COMMUNITY): Payer: Self-pay

## 2017-09-30 NOTE — Pre-Procedure Instructions (Signed)
JANMICHAEL GIRAUD  09/30/2017      [place], [place] [address] Phone: [phone] Fax: [phone]    Your procedure is scheduled on Friday September 27.  Report to [facility] Admitting at 5:30 A.M.  Call this number if you have problems the morning of surgery:  [phone]   Remember:  Do not eat or drink after midnight.   Take these medicines the morning of surgery with A SIP OF WATER:   tamsulosin (flomax) Tramadol (ultram) if needed Nasal Spray  STOP TAKING pseudoephedrine (Sudafed) NOW  7 days prior to surgery STOP taking any Aspirin(unless otherwise instructed by your surgeon), diclofenac (voltaren), Aleve, Naproxen, Ibuprofen, Motrin, Advil, Goody's, BC's, all herbal medications, fish oil, and all vitamins     Do not wear jewelry, make-up or nail polish.  Do not wear lotions, powders, or perfumes, or deodorant.  Do not shave 48 hours prior to surgery.  Men may shave face and neck.  Do not bring valuables to the hospital.  [facility] is not responsible for any belongings or valuables.  Contacts, dentures or bridgework may not be worn into surgery.  Leave your suitcase in the car.  After surgery it may be brought to your room.  For patients admitted to the hospital, discharge time will be determined by your treatment team.  Patients discharged the day of surgery will not be allowed to drive home.    Special instructions:    [facility]- Preparing For Surgery  Before surgery, you can play an important role. Because skin is not sterile, your skin needs to be as free of germs as possible. You can reduce the number of germs on your skin by washing with CHG (chlorahexidine gluconate) Soap before surgery.  CHG is an antiseptic cleaner which kills germs and bonds with the skin to continue killing germs even after washing.    Oral Hygiene is also important to reduce your risk of  infection.  Remember - BRUSH YOUR TEETH THE MORNING OF SURGERY WITH YOUR REGULAR TOOTHPASTE  Please do not use if you have an allergy to CHG or antibacterial soaps. If your skin becomes reddened/irritated stop using the CHG.  Do not shave (including legs and underarms) for at least 48 hours prior to first CHG shower. It is OK to shave your face.  Please follow these instructions carefully.   1. Shower the NIGHT BEFORE SURGERY and the MORNING OF SURGERY with CHG.   2. If you chose to wash your hair, wash your hair first as usual with your normal shampoo.  3. After you shampoo, rinse your hair and body thoroughly to remove the shampoo.  4. Use CHG as you would any other liquid soap. You can apply CHG directly to the skin and wash gently with a scrungie or a clean washcloth.   5. Apply the CHG Soap to your body ONLY FROM THE NECK DOWN.  Do not use on open wounds or open sores. Avoid contact with your eyes, ears, mouth and genitals (private parts). Wash Face and genitals (private parts)  with your normal soap.  6. Wash thoroughly, paying special attention to the area where your surgery will be performed.  7. Thoroughly rinse your body with warm water from the neck down.  8. DO NOT shower/wash with your normal soap after using and rinsing off the CHG Soap.  9. Pat yourself dry with a  CLEAN TOWEL.  10. Wear CLEAN PAJAMAS to bed the night before surgery, wear comfortable clothes the morning of surgery  11. Place CLEAN SHEETS on your bed the night of your first shower and DO NOT SLEEP WITH PETS.    Day of Surgery:  Do not apply any deodorants/lotions.  Please wear clean clothes to the hospital/surgery center.   Remember to brush your teeth WITH YOUR REGULAR TOOTHPASTE.    Please read over the following fact sheets that you were given. Coughing and Deep Breathing, MRSA Information and Surgical Site Infection Prevention

## 2017-10-03 ENCOUNTER — Other Ambulatory Visit: Payer: Self-pay

## 2017-10-03 ENCOUNTER — Encounter (HOSPITAL_COMMUNITY): Payer: Self-pay

## 2017-10-03 ENCOUNTER — Encounter (HOSPITAL_COMMUNITY)
Admission: RE | Admit: 2017-10-03 | Discharge: 2017-10-03 | Disposition: A | Discharge status: Home / Self Care | Payer: PPO | Source: Ambulatory Visit | Attending: Neurosurgery | Admitting: Neurosurgery

## 2017-10-03 DIAGNOSIS — M47896 Other spondylosis, lumbar region: Secondary | ICD-10-CM | POA: Diagnosis not present

## 2017-10-03 DIAGNOSIS — Z01818 Encounter for other preprocedural examination: Secondary | ICD-10-CM | POA: Diagnosis not present

## 2017-10-03 DIAGNOSIS — M4316 Spondylolisthesis, lumbar region: Secondary | ICD-10-CM | POA: Insufficient documentation

## 2017-10-03 DIAGNOSIS — M5126 Other intervertebral disc displacement, lumbar region: Secondary | ICD-10-CM | POA: Insufficient documentation

## 2017-10-03 DIAGNOSIS — M4126 Other idiopathic scoliosis, lumbar region: Secondary | ICD-10-CM | POA: Diagnosis not present

## 2017-10-03 DIAGNOSIS — M545 Low back pain: Secondary | ICD-10-CM | POA: Diagnosis not present

## 2017-10-03 DIAGNOSIS — I10 Essential (primary) hypertension: Secondary | ICD-10-CM | POA: Insufficient documentation

## 2017-10-03 DIAGNOSIS — M48061 Spinal stenosis, lumbar region without neurogenic claudication: Secondary | ICD-10-CM | POA: Diagnosis not present

## 2017-10-03 HISTORY — DX: Functional dyspepsia: K30

## 2017-10-03 LAB — BASIC METABOLIC PANEL
Anion gap: 8 (ref 5–15)
BUN: 10 mg/dL (ref 8–23)
CHLORIDE: 103 mmol/L (ref 98–111)
CO2: 25 mmol/L (ref 22–32)
Calcium: 9.2 mg/dL (ref 8.9–10.3)
Creatinine, Ser: 0.9 mg/dL (ref 0.61–1.24)
GFR calc Af Amer: >60 mL/min (ref >60)
GFR calc non Af Amer: >60 mL/min (ref >60)
Glucose, Bld: 97 mg/dL (ref 70–99)
Potassium: 3.8 mmol/L (ref 3.5–5.1)
Sodium: 136 mmol/L (ref 135–145)

## 2017-10-03 LAB — TYPE AND SCREEN
ABO/RH(D): A POS
ANTIBODY SCREEN: NEGATIVE

## 2017-10-03 LAB — CBC
HCT: 45.6 % (ref 39.0–52.0)
Hemoglobin: 15.4 g/dL (ref 13.0–17.0)
MCH: 33 pg (ref 26.0–34.0)
MCHC: 33.8 g/dL (ref 30.0–36.0)
MCV: 97.6 fL (ref 78.0–100.0)
PLATELETS: 214 10*3/uL (ref 150–400)
RBC: 4.67 MIL/uL (ref 4.22–5.81)
RDW: 12 % (ref 11.5–15.5)
WBC: 8.2 10*3/uL (ref 4.0–10.5)

## 2017-10-03 LAB — SURGICAL PCR SCREEN
MRSA, PCR: NEGATIVE
Staphylococcus aureus: NEGATIVE

## 2017-10-03 NOTE — Progress Notes (Signed)
PCP - Lona Kettle Cardiologist - denies  EKG - 10/03/2017  Stress Test - done in the 90s, normal per patient   Patient denies shortness of breath, fever, cough and chest pain at PAT appointment   Patient verbalized understanding of instructions that were given to them at the PAT appointment. Patient was also instructed that they will need to review over the PAT instructions again at home before surgery.

## 2017-10-06 NOTE — Anesthesia Preprocedure Evaluation (Addendum)
Anesthesia Evaluation  Patient identified by MRN, date of birth, ID band Patient awake    Reviewed: Allergy & Precautions, NPO status , Patient's Chart, lab work & pertinent test results  Airway Mallampati: II  TM Distance: >3 FB Neck ROM: Full    Dental no notable dental hx. (+) Teeth Intact, Dental Advisory Given   Pulmonary neg pulmonary ROS, former smoker,    Pulmonary exam normal breath sounds clear to auscultation       Cardiovascular hypertension, Pt. on medications negative cardio ROS Normal cardiovascular exam Rhythm:Regular Rate:Normal     Neuro/Psych negative neurological ROS  negative psych ROS   GI/Hepatic negative GI ROS, Neg liver ROS,   Endo/Other    Renal/GU negative Renal ROS     Musculoskeletal negative musculoskeletal ROS (+)   Abdominal   Peds  Hematology negative hematology ROS (+)   Anesthesia Other Findings   Reproductive/Obstetrics                            Lab Results  Component Value Date   CREATININE 0.90 10/03/2017   BUN 10 10/03/2017   NA 136 10/03/2017   K 3.8 10/03/2017   CL 103 10/03/2017   CO2 25 10/03/2017    Lab Results  Component Value Date   CREATININE 0.90 10/03/2017   BUN 10 10/03/2017   NA 136 10/03/2017   K 3.8 10/03/2017   CL 103 10/03/2017   CO2 25 10/03/2017    Anesthesia Physical Anesthesia Plan  ASA: II  Anesthesia Plan: General   Post-op Pain Management:    Induction: Intravenous  PONV Risk Score and Plan: Treatment may vary due to age or medical condition, Ondansetron and Dexamethasone  Airway Management Planned: Oral ETT  Additional Equipment:   Intra-op Plan:   Post-operative Plan: Extubation in OR  Informed Consent: I have reviewed the patients History and Physical, chart, labs and discussed the procedure including the risks, benefits and alternatives for the proposed anesthesia with the patient or  authorized representative who has indicated his/her understanding and acceptance.   Dental advisory given  Plan Discussed with:   Anesthesia Plan Comments:         Anesthesia Quick Evaluation

## 2017-10-07 ENCOUNTER — Inpatient Hospital Stay (HOSPITAL_COMMUNITY): Payer: PPO

## 2017-10-07 ENCOUNTER — Inpatient Hospital Stay (HOSPITAL_COMMUNITY): Payer: PPO | Admitting: Anesthesiology

## 2017-10-07 ENCOUNTER — Inpatient Hospital Stay (HOSPITAL_COMMUNITY)
Admission: RE | Admit: 2017-10-07 | Discharge: 2017-10-09 | DRG: 455 | Disposition: A | Discharge status: Home / Self Care | Payer: PPO | Attending: Neurosurgery | Admitting: Neurosurgery

## 2017-10-07 ENCOUNTER — Inpatient Hospital Stay (HOSPITAL_COMMUNITY)
Admission: RE | Disposition: A | Discharge status: Home / Self Care | Payer: Self-pay | Source: Home / Self Care | Attending: Neurosurgery

## 2017-10-07 DIAGNOSIS — Z79899 Other long term (current) drug therapy: Secondary | ICD-10-CM

## 2017-10-07 DIAGNOSIS — M5116 Intervertebral disc disorders with radiculopathy, lumbar region: Secondary | ICD-10-CM | POA: Diagnosis present

## 2017-10-07 DIAGNOSIS — Z87891 Personal history of nicotine dependence: Secondary | ICD-10-CM | POA: Diagnosis not present

## 2017-10-07 DIAGNOSIS — M4316 Spondylolisthesis, lumbar region: Secondary | ICD-10-CM | POA: Diagnosis not present

## 2017-10-07 DIAGNOSIS — Z79891 Long term (current) use of opiate analgesic: Secondary | ICD-10-CM

## 2017-10-07 DIAGNOSIS — M48061 Spinal stenosis, lumbar region without neurogenic claudication: Secondary | ICD-10-CM | POA: Diagnosis present

## 2017-10-07 DIAGNOSIS — Z823 Family history of stroke: Secondary | ICD-10-CM

## 2017-10-07 DIAGNOSIS — I10 Essential (primary) hypertension: Secondary | ICD-10-CM | POA: Diagnosis present

## 2017-10-07 DIAGNOSIS — Z981 Arthrodesis status: Secondary | ICD-10-CM | POA: Diagnosis not present

## 2017-10-07 DIAGNOSIS — Z419 Encounter for procedure for purposes other than remedying health state, unspecified: Secondary | ICD-10-CM

## 2017-10-07 DIAGNOSIS — M5416 Radiculopathy, lumbar region: Secondary | ICD-10-CM | POA: Diagnosis not present

## 2017-10-07 DIAGNOSIS — M4326 Fusion of spine, lumbar region: Secondary | ICD-10-CM | POA: Diagnosis not present

## 2017-10-07 DIAGNOSIS — M4186 Other forms of scoliosis, lumbar region: Secondary | ICD-10-CM | POA: Diagnosis not present

## 2017-10-07 DIAGNOSIS — M419 Scoliosis, unspecified: Secondary | ICD-10-CM | POA: Diagnosis present

## 2017-10-07 HISTORY — PX: ANTERIOR LAT LUMBAR FUSION: SHX1168

## 2017-10-07 HISTORY — PX: LUMBAR PERCUTANEOUS PEDICLE SCREW 2 LEVEL: SHX5561

## 2017-10-07 SURGERY — ANTERIOR LATERAL LUMBAR FUSION 2 LEVELS
Anesthesia: General | Site: Spine Lumbar | Laterality: Right

## 2017-10-07 MED ORDER — CEFAZOLIN SODIUM 1 G IJ SOLR
INTRAMUSCULAR | Status: AC
  Filled 2017-10-07: qty 20

## 2017-10-07 MED ORDER — BUPIVACAINE LIPOSOME 1.3 % IJ SUSP
20.0000 mL | Freq: Once | INTRAMUSCULAR | Status: DC
  Filled 2017-10-07: qty 20

## 2017-10-07 MED ORDER — HYDROMORPHONE HCL 1 MG/ML IJ SOLN
0.2500 mg | INTRAMUSCULAR | Status: DC | PRN
  Administered 2017-10-07 (×4): 0.5 mg via INTRAVENOUS

## 2017-10-07 MED ORDER — GABAPENTIN 300 MG PO CAPS
ORAL_CAPSULE | ORAL | Status: AC
  Administered 2017-10-07: 300 mg via ORAL
  Filled 2017-10-07: qty 1

## 2017-10-07 MED ORDER — PROMETHAZINE HCL 25 MG/ML IJ SOLN: 6.2500 mg | INTRAMUSCULAR | Status: DC | PRN

## 2017-10-07 MED ORDER — THROMBIN 5000 UNITS EX SOLR
CUTANEOUS | Status: AC
  Filled 2017-10-07: qty 5000

## 2017-10-07 MED ORDER — THROMBIN (RECOMBINANT) 20000 UNITS EX SOLR
CUTANEOUS | Status: AC
  Filled 2017-10-07: qty 20000

## 2017-10-07 MED ORDER — METHOCARBAMOL 1000 MG/10ML IJ SOLN
500.0000 mg | Freq: Four times a day (QID) | INTRAVENOUS | Status: DC | PRN
  Filled 2017-10-07: qty 5

## 2017-10-07 MED ORDER — CALCIUM POLYCARBOPHIL 625 MG PO TABS
625.0000 mg | ORAL_TABLET | Freq: Every day | ORAL | Status: DC
  Administered 2017-10-08 – 2017-10-09 (×2): 625 mg via ORAL
  Filled 2017-10-07 (×3): qty 1

## 2017-10-07 MED ORDER — ACETAMINOPHEN 325 MG PO TABS
650.0000 mg | ORAL_TABLET | ORAL | Status: DC | PRN
  Administered 2017-10-07: 650 mg via ORAL
  Filled 2017-10-07: qty 2

## 2017-10-07 MED ORDER — LACTATED RINGERS IV SOLN
INTRAVENOUS | Status: DC | PRN
  Administered 2017-10-07 (×3): via INTRAVENOUS

## 2017-10-07 MED ORDER — ONDANSETRON HCL 4 MG/2ML IJ SOLN: 4.0000 mg | Freq: Four times a day (QID) | INTRAMUSCULAR | Status: DC | PRN

## 2017-10-07 MED ORDER — ACETAMINOPHEN 10 MG/ML IV SOLN
INTRAVENOUS | Status: AC
  Filled 2017-10-07: qty 100

## 2017-10-07 MED ORDER — MEPERIDINE HCL 50 MG/ML IJ SOLN: 6.2500 mg | INTRAMUSCULAR | Status: DC | PRN

## 2017-10-07 MED ORDER — DOCUSATE SODIUM 100 MG PO CAPS
100.0000 mg | ORAL_CAPSULE | Freq: Two times a day (BID) | ORAL | Status: DC
  Administered 2017-10-07 – 2017-10-09 (×4): 100 mg via ORAL
  Filled 2017-10-07 (×4): qty 1

## 2017-10-07 MED ORDER — POLYETHYLENE GLYCOL 3350 17 G PO PACK: 17.0000 g | PACK | Freq: Every day | ORAL | Status: DC | PRN

## 2017-10-07 MED ORDER — PROPOFOL 500 MG/50ML IV EMUL
INTRAVENOUS | Status: DC | PRN
  Administered 2017-10-07: 90 ug/kg/min via INTRAVENOUS
  Administered 2017-10-07: 100 ug/kg/min via INTRAVENOUS

## 2017-10-07 MED ORDER — KETOTIFEN FUMARATE 0.025 % OP SOLN
1.0000 [drp] | Freq: Every day | OPHTHALMIC | Status: DC | PRN
  Filled 2017-10-07: qty 5

## 2017-10-07 MED ORDER — PROPOFOL 10 MG/ML IV BOLUS
INTRAVENOUS | Status: AC
  Filled 2017-10-07: qty 20

## 2017-10-07 MED ORDER — CHLORHEXIDINE GLUCONATE CLOTH 2 % EX PADS: 6.0000 | MEDICATED_PAD | Freq: Once | CUTANEOUS | Status: DC

## 2017-10-07 MED ORDER — OXYCODONE HCL 5 MG PO TABS
5.0000 mg | ORAL_TABLET | ORAL | Status: DC | PRN
  Administered 2017-10-07 (×2): 5 mg via ORAL
  Filled 2017-10-07: qty 1

## 2017-10-07 MED ORDER — 0.9 % SODIUM CHLORIDE (POUR BTL) OPTIME
TOPICAL | Status: DC | PRN
  Administered 2017-10-07: 1000 mL

## 2017-10-07 MED ORDER — ACETAMINOPHEN 500 MG PO TABS
1000.0000 mg | ORAL_TABLET | Freq: Once | ORAL | Status: AC
  Administered 2017-10-07: 1000 mg via ORAL

## 2017-10-07 MED ORDER — LIDOCAINE-EPINEPHRINE 1 %-1:100000 IJ SOLN
INTRAMUSCULAR | Status: AC
  Filled 2017-10-07: qty 1

## 2017-10-07 MED ORDER — TRAMADOL HCL 50 MG PO TABS: 50.0000 mg | ORAL_TABLET | Freq: Three times a day (TID) | ORAL | Status: DC | PRN

## 2017-10-07 MED ORDER — HYDROCODONE-ACETAMINOPHEN 5-325 MG PO TABS
2.0000 | ORAL_TABLET | ORAL | Status: DC | PRN
  Administered 2017-10-07 – 2017-10-09 (×7): 2 via ORAL
  Filled 2017-10-07 (×7): qty 2

## 2017-10-07 MED ORDER — DEXAMETHASONE SODIUM PHOSPHATE 10 MG/ML IJ SOLN
INTRAMUSCULAR | Status: DC | PRN
  Administered 2017-10-07: 10 mg via INTRAVENOUS

## 2017-10-07 MED ORDER — FLEET ENEMA 7-19 GM/118ML RE ENEM
1.0000 | ENEMA | Freq: Once | RECTAL | Status: AC | PRN
  Administered 2017-10-08: 1 via RECTAL
  Filled 2017-10-07: qty 1

## 2017-10-07 MED ORDER — ACETAMINOPHEN 10 MG/ML IV SOLN
1000.0000 mg | Freq: Once | INTRAVENOUS | Status: DC | PRN
  Administered 2017-10-07: 1000 mg via INTRAVENOUS

## 2017-10-07 MED ORDER — FENTANYL CITRATE (PF) 100 MCG/2ML IJ SOLN
INTRAMUSCULAR | Status: DC | PRN
  Administered 2017-10-07: 100 ug via INTRAVENOUS
  Administered 2017-10-07: 50 ug via INTRAVENOUS
  Administered 2017-10-07: 100 ug via INTRAVENOUS
  Administered 2017-10-07 (×2): 50 ug via INTRAVENOUS
  Administered 2017-10-07: 150 ug via INTRAVENOUS

## 2017-10-07 MED ORDER — OXYCODONE HCL 5 MG PO TABS
ORAL_TABLET | ORAL | Status: AC
  Filled 2017-10-07: qty 1

## 2017-10-07 MED ORDER — HYDROMORPHONE HCL 1 MG/ML IJ SOLN
INTRAMUSCULAR | Status: AC
  Filled 2017-10-07: qty 1

## 2017-10-07 MED ORDER — ACETAMINOPHEN 650 MG RE SUPP: 650.0000 mg | RECTAL | Status: DC | PRN

## 2017-10-07 MED ORDER — ACETAMINOPHEN 10 MG/ML IV SOLN: 1000.0000 mg | Freq: Once | INTRAVENOUS | Status: DC | PRN

## 2017-10-07 MED ORDER — MORPHINE SULFATE (PF) 2 MG/ML IV SOLN
INTRAVENOUS | Status: AC
  Filled 2017-10-07: qty 1

## 2017-10-07 MED ORDER — MIDAZOLAM HCL 2 MG/2ML IJ SOLN
INTRAMUSCULAR | Status: AC
  Filled 2017-10-07: qty 2

## 2017-10-07 MED ORDER — ZOLPIDEM TARTRATE 5 MG PO TABS: 5.0000 mg | ORAL_TABLET | Freq: Every evening | ORAL | Status: DC | PRN

## 2017-10-07 MED ORDER — BISACODYL 10 MG RE SUPP: 10.0000 mg | Freq: Every day | RECTAL | Status: DC | PRN

## 2017-10-07 MED ORDER — SODIUM CHLORIDE 0.9% FLUSH
3.0000 mL | Freq: Two times a day (BID) | INTRAVENOUS | Status: DC
  Administered 2017-10-08 – 2017-10-09 (×3): 3 mL via INTRAVENOUS

## 2017-10-07 MED ORDER — METHOCARBAMOL 500 MG PO TABS
ORAL_TABLET | ORAL | Status: AC
  Filled 2017-10-07: qty 1

## 2017-10-07 MED ORDER — CEFAZOLIN SODIUM-DEXTROSE 2-4 GM/100ML-% IV SOLN
2.0000 g | INTRAVENOUS | Status: AC
  Administered 2017-10-07 (×2): 2 g via INTRAVENOUS

## 2017-10-07 MED ORDER — ACETAMINOPHEN 500 MG PO TABS
ORAL_TABLET | ORAL | Status: AC
  Administered 2017-10-07: 1000 mg via ORAL
  Filled 2017-10-07: qty 2

## 2017-10-07 MED ORDER — KCL IN DEXTROSE-NACL 20-5-0.45 MEQ/L-%-% IV SOLN
INTRAVENOUS | Status: DC
  Administered 2017-10-07: 19:00:00 via INTRAVENOUS
  Filled 2017-10-07: qty 1000

## 2017-10-07 MED ORDER — GABAPENTIN 300 MG PO CAPS
300.0000 mg | ORAL_CAPSULE | Freq: Once | ORAL | Status: AC
  Administered 2017-10-07: 300 mg via ORAL

## 2017-10-07 MED ORDER — FENTANYL CITRATE (PF) 250 MCG/5ML IJ SOLN
INTRAMUSCULAR | Status: AC
  Filled 2017-10-07: qty 5

## 2017-10-07 MED ORDER — PSEUDOEPHEDRINE HCL ER 120 MG PO TB12
120.0000 mg | ORAL_TABLET | Freq: Two times a day (BID) | ORAL | Status: DC | PRN
  Filled 2017-10-07: qty 1

## 2017-10-07 MED ORDER — PHENOL 1.4 % MT LIQD: 1.0000 | OROMUCOSAL | Status: DC | PRN

## 2017-10-07 MED ORDER — DEXMEDETOMIDINE BOLUS VIA INFUSION
0.2500 ug/kg | Freq: Once | INTRAVENOUS | Status: AC
  Administered 2017-10-07: 19.83 ug via INTRAVENOUS

## 2017-10-07 MED ORDER — ASPIRIN EFFERVESCENT 325 MG PO TBEF: 650.0000 mg | EFFERVESCENT_TABLET | Freq: Every day | ORAL | Status: DC | PRN

## 2017-10-07 MED ORDER — LISINOPRIL 20 MG PO TABS
40.0000 mg | ORAL_TABLET | Freq: Every day | ORAL | Status: DC
  Administered 2017-10-08 – 2017-10-09 (×2): 40 mg via ORAL
  Filled 2017-10-07 (×2): qty 2

## 2017-10-07 MED ORDER — BUPIVACAINE HCL (PF) 0.5 % IJ SOLN
INTRAMUSCULAR | Status: AC
  Filled 2017-10-07: qty 30

## 2017-10-07 MED ORDER — PHENYLEPHRINE HCL 10 MG/ML IJ SOLN
INTRAMUSCULAR | Status: DC | PRN
  Administered 2017-10-07 (×4): 80 ug via INTRAVENOUS

## 2017-10-07 MED ORDER — ONDANSETRON HCL 4 MG/2ML IJ SOLN
INTRAMUSCULAR | Status: DC | PRN
  Administered 2017-10-07: 4 mg via INTRAVENOUS

## 2017-10-07 MED ORDER — THROMBIN 20000 UNITS EX SOLR
CUTANEOUS | Status: DC | PRN
  Administered 2017-10-07: 12:00:00 via TOPICAL

## 2017-10-07 MED ORDER — MIDAZOLAM HCL 5 MG/5ML IJ SOLN
INTRAMUSCULAR | Status: DC | PRN
  Administered 2017-10-07: 2 mg via INTRAVENOUS

## 2017-10-07 MED ORDER — METHOCARBAMOL 500 MG PO TABS
500.0000 mg | ORAL_TABLET | Freq: Four times a day (QID) | ORAL | Status: DC | PRN
  Administered 2017-10-07 – 2017-10-08 (×2): 500 mg via ORAL
  Filled 2017-10-07: qty 1

## 2017-10-07 MED ORDER — HYDROMORPHONE HCL 1 MG/ML IJ SOLN
0.2500 mg | INTRAMUSCULAR | Status: DC | PRN
  Administered 2017-10-07 (×2): 0.5 mg via INTRAVENOUS

## 2017-10-07 MED ORDER — PROPOFOL 10 MG/ML IV BOLUS
INTRAVENOUS | Status: DC | PRN
  Administered 2017-10-07: 60 mg via INTRAVENOUS

## 2017-10-07 MED ORDER — MORPHINE SULFATE (PF) 2 MG/ML IV SOLN
2.0000 mg | INTRAVENOUS | Status: DC | PRN
  Administered 2017-10-07 – 2017-10-08 (×4): 2 mg via INTRAVENOUS
  Filled 2017-10-07 (×3): qty 1

## 2017-10-07 MED ORDER — FAMOTIDINE IN NACL 20-0.9 MG/50ML-% IV SOLN
20.0000 mg | Freq: Two times a day (BID) | INTRAVENOUS | Status: DC
  Administered 2017-10-07 – 2017-10-08 (×3): 20 mg via INTRAVENOUS
  Filled 2017-10-07 (×3): qty 50

## 2017-10-07 MED ORDER — THROMBIN 5000 UNITS EX SOLR
OROMUCOSAL | Status: DC | PRN
  Administered 2017-10-07: 5 mL via TOPICAL

## 2017-10-07 MED ORDER — TAMSULOSIN HCL 0.4 MG PO CAPS: 0.4000 mg | ORAL_CAPSULE | ORAL | Status: DC

## 2017-10-07 MED ORDER — SODIUM CHLORIDE 0.9 % IV SOLN: 250.0000 mL | INTRAVENOUS | Status: DC

## 2017-10-07 MED ORDER — CEFAZOLIN SODIUM-DEXTROSE 2-4 GM/100ML-% IV SOLN
2.0000 g | Freq: Three times a day (TID) | INTRAVENOUS | Status: AC
  Administered 2017-10-07 – 2017-10-08 (×2): 2 g via INTRAVENOUS
  Filled 2017-10-07 (×2): qty 100

## 2017-10-07 MED ORDER — ONDANSETRON HCL 4 MG PO TABS: 4.0000 mg | ORAL_TABLET | Freq: Four times a day (QID) | ORAL | Status: DC | PRN

## 2017-10-07 MED ORDER — BUPIVACAINE HCL (PF) 0.5 % IJ SOLN
INTRAMUSCULAR | Status: DC | PRN
  Administered 2017-10-07: 10 mL
  Administered 2017-10-07: 7.5 mL

## 2017-10-07 MED ORDER — ALUM & MAG HYDROXIDE-SIMETH 200-200-20 MG/5ML PO SUSP
30.0000 mL | Freq: Four times a day (QID) | ORAL | Status: DC | PRN
  Administered 2017-10-08: 30 mL via ORAL
  Filled 2017-10-07: qty 30

## 2017-10-07 MED ORDER — ALBUMIN HUMAN 5 % IV SOLN
INTRAVENOUS | Status: DC | PRN
  Administered 2017-10-07: 12:00:00 via INTRAVENOUS

## 2017-10-07 MED ORDER — BUPIVACAINE LIPOSOME 1.3 % IJ SUSP
INTRAMUSCULAR | Status: DC | PRN
  Administered 2017-10-07: 20 mL

## 2017-10-07 MED ORDER — ALUM HYDROXIDE-MAG TRISILICATE 80-20 MG PO CHEW
2.0000 | CHEWABLE_TABLET | Freq: Every day | ORAL | Status: DC | PRN
  Administered 2017-10-08: 2 via ORAL
  Filled 2017-10-07 (×4): qty 2

## 2017-10-07 MED ORDER — MENTHOL 3 MG MT LOZG: 1.0000 | LOZENGE | OROMUCOSAL | Status: DC | PRN

## 2017-10-07 MED ORDER — HYDROCODONE-ACETAMINOPHEN 7.5-325 MG PO TABS: 1.0000 | ORAL_TABLET | Freq: Once | ORAL | Status: DC | PRN

## 2017-10-07 MED ORDER — OXYMETAZOLINE HCL 0.05 % NA SOLN
1.0000 | Freq: Every day | NASAL | Status: DC | PRN
  Filled 2017-10-07: qty 15

## 2017-10-07 MED ORDER — ATORVASTATIN CALCIUM 40 MG PO TABS
40.0000 mg | ORAL_TABLET | Freq: Every day | ORAL | Status: DC
  Administered 2017-10-08 – 2017-10-09 (×2): 40 mg via ORAL
  Filled 2017-10-07 (×2): qty 1

## 2017-10-07 MED ORDER — LIDOCAINE-EPINEPHRINE 1 %-1:100000 IJ SOLN
INTRAMUSCULAR | Status: DC | PRN
  Administered 2017-10-07: 7.5 mL
  Administered 2017-10-07: 10 mL

## 2017-10-07 MED ORDER — CEFAZOLIN SODIUM-DEXTROSE 2-4 GM/100ML-% IV SOLN
INTRAVENOUS | Status: AC
  Filled 2017-10-07: qty 100

## 2017-10-07 MED ORDER — SODIUM CHLORIDE 0.9 % IV SOLN
INTRAVENOUS | Status: DC | PRN
  Administered 2017-10-07: 20 ug/min via INTRAVENOUS

## 2017-10-07 MED ORDER — SODIUM CHLORIDE 0.9% FLUSH: 3.0000 mL | INTRAVENOUS | Status: DC | PRN

## 2017-10-07 SURGICAL SUPPLY — 75 items
ADH SKN CLS APL DERMABOND .7 (GAUZE/BANDAGES/DRESSINGS) ×4
BONE CANC CHIPS 20CC PCAN1/4 (Bone Implant) ×3 IMPLANT
BONE CANC CHIPS 40CC CAN1/2 (Bone Implant) ×3 IMPLANT
BUR MATCHSTICK NEURO 3.0 LAGG (BURR) ×1 IMPLANT
BUR PRECISION FLUTE 5.0 (BURR) ×1 IMPLANT
CAGE MODULUS XLW 10X22X60 - 10 (Cage) ×1 IMPLANT
CARTRIDGE OIL MAESTRO DRILL (MISCELLANEOUS) ×2 IMPLANT
CHIPS CANC BONE 20CC PCAN1/4 (Bone Implant) ×2 IMPLANT
CHIPS CANC BONE 40CC CAN1/2 (Bone Implant) ×2 IMPLANT
CONT SPEC 4OZ CLIKSEAL STRL BL (MISCELLANEOUS) ×3 IMPLANT
COVER BACK TABLE 60X90IN (DRAPES) ×3 IMPLANT
DECANTER SPIKE VIAL GLASS SM (MISCELLANEOUS) ×6 IMPLANT
DERMABOND ADVANCED (GAUZE/BANDAGES/DRESSINGS) ×2
DERMABOND ADVANCED .7 DNX12 (GAUZE/BANDAGES/DRESSINGS) ×4 IMPLANT
DIFFUSER DRILL AIR PNEUMATIC (MISCELLANEOUS) ×2 IMPLANT
DRAPE C-ARM 42X72 X-RAY (DRAPES) ×7 IMPLANT
DRAPE C-ARMOR (DRAPES) ×7 IMPLANT
DRAPE LAPAROTOMY 100X72X124 (DRAPES) ×6 IMPLANT
DRAPE POUCH INSTRU U-SHP 10X18 (DRAPES) ×2 IMPLANT
DRAPE SURG 17X23 STRL (DRAPES) ×2 IMPLANT
DRSG OPSITE POSTOP 3X4 (GAUZE/BANDAGES/DRESSINGS) ×2 IMPLANT
DRSG OPSITE POSTOP 4X8 (GAUZE/BANDAGES/DRESSINGS) ×1 IMPLANT
DURAPREP 26ML APPLICATOR (WOUND CARE) ×6 IMPLANT
ELECT REM PT RETURN 9FT ADLT (ELECTROSURGICAL) ×6
ELECTRODE REM PT RTRN 9FT ADLT (ELECTROSURGICAL) ×4 IMPLANT
GAUZE 4X4 16PLY RFD (DISPOSABLE) IMPLANT
GAUZE SPONGE 4X4 12PLY STRL (GAUZE/BANDAGES/DRESSINGS) ×2 IMPLANT
GLOVE BIO SURGEON STRL SZ8 (GLOVE) ×9 IMPLANT
GLOVE BIOGEL PI IND STRL 8 (GLOVE) ×4 IMPLANT
GLOVE BIOGEL PI IND STRL 8.5 (GLOVE) ×6 IMPLANT
GLOVE BIOGEL PI INDICATOR 8 (GLOVE) ×2
GLOVE BIOGEL PI INDICATOR 8.5 (GLOVE) ×3
GLOVE ECLIPSE 8.0 STRL XLNG CF (GLOVE) ×7 IMPLANT
GOWN STRL REUS W/ TWL LRG LVL3 (GOWN DISPOSABLE) IMPLANT
GOWN STRL REUS W/ TWL XL LVL3 (GOWN DISPOSABLE) ×6 IMPLANT
GOWN STRL REUS W/TWL 2XL LVL3 (GOWN DISPOSABLE) ×5 IMPLANT
GOWN STRL REUS W/TWL LRG LVL3 (GOWN DISPOSABLE)
GOWN STRL REUS W/TWL XL LVL3 (GOWN DISPOSABLE) ×9
GRAFT BNE CANC CHIPS 1-8 20CC (Bone Implant) IMPLANT
GRAFT BNE CHIP CANC 1-8 40 (Bone Implant) IMPLANT
HEMOSTAT POWDER SURGIFOAM 1G (HEMOSTASIS) ×1 IMPLANT
KIT BASIN OR (CUSTOM PROCEDURE TRAY) ×6 IMPLANT
KIT DILATOR XLIF 5 (KITS) ×1 IMPLANT
KIT INFUSE X SMALL 1.4CC (Orthopedic Implant) ×1 IMPLANT
KIT POSITION SURG JACKSON T1 (MISCELLANEOUS) ×3 IMPLANT
KIT SURGICAL ACCESS MAXCESS 4 (KITS) ×1 IMPLANT
KIT TURNOVER KIT B (KITS) ×6 IMPLANT
MARKER SKIN DUAL TIP RULER LAB (MISCELLANEOUS) ×3 IMPLANT
MODULE NVM5 NEXT GEN EMG (NEEDLE) ×1 IMPLANT
MODULUS XLW 10X22X55MM 10 (Spine Construct) ×1 IMPLANT
NDL HYPO 21X1.5 ECLIPSE (NEEDLE) IMPLANT
NDL HYPO 25X1 1.5 SAFETY (NEEDLE) ×4 IMPLANT
NEEDLE HYPO 21X1.5 ECLIPSE (NEEDLE) ×3 IMPLANT
NEEDLE HYPO 25X1 1.5 SAFETY (NEEDLE) ×6 IMPLANT
NS IRRIG 1000ML POUR BTL (IV SOLUTION) ×6 IMPLANT
OIL CARTRIDGE MAESTRO DRILL (MISCELLANEOUS)
PACK LAMINECTOMY NEURO (CUSTOM PROCEDURE TRAY) ×6 IMPLANT
PAD ARMBOARD 7.5X6 YLW CONV (MISCELLANEOUS) ×9 IMPLANT
PUTTY BONE ATTRAX 10CC STRIP (Putty) ×1 IMPLANT
ROD RELINE LORD 5.5X80MM (Rod) ×2 IMPLANT
SCREW LOCK RELINE 5.5 TULIP (Screw) ×6 IMPLANT
SCREW RELINE-O POLY 6.5X50MM (Screw) ×4 IMPLANT
SCREW RELINE-O POLY 7.5X45 (Screw) ×2 IMPLANT
SPONGE SURGIFOAM ABS GEL 100 (HEMOSTASIS) ×1 IMPLANT
STAPLER SKIN PROX WIDE 3.9 (STAPLE) ×3 IMPLANT
SUT VIC AB 0 CT1 18XCR BRD8 (SUTURE) IMPLANT
SUT VIC AB 0 CT1 8-18 (SUTURE) ×3
SUT VIC AB 1 CT1 18XBRD ANBCTR (SUTURE) ×6 IMPLANT
SUT VIC AB 1 CT1 8-18 (SUTURE) ×6
SUT VIC AB 2-0 CT1 18 (SUTURE) ×8 IMPLANT
SUT VIC AB 3-0 SH 8-18 (SUTURE) ×8 IMPLANT
TOWEL GREEN STERILE (TOWEL DISPOSABLE) ×3 IMPLANT
TOWEL GREEN STERILE FF (TOWEL DISPOSABLE) ×3 IMPLANT
TRAY FOLEY MTR SLVR 16FR STAT (SET/KITS/TRAYS/PACK) ×5 IMPLANT
WATER STERILE IRR 1000ML POUR (IV SOLUTION) ×5 IMPLANT

## 2017-10-07 NOTE — Brief Op Note (Signed)
10/07/2017  12:40 PM  PATIENT:  Michael Osborn  72 y.o. male  PRE-OPERATIVE DIAGNOSIS:  Lumbar stenosis without neurogenic claudication, lumbar scoliosis, lumbar spondylolisthesis, lumbar foraminal stenosis, lumbago, radiculopathy  POST-OPERATIVE DIAGNOSIS: Lumbar stenosis without neurogenic claudication, lumbar scoliosis, lumbar spondylolisthesis, lumbar foraminal stenosis, lumbago, radiculopathy  PROCEDURE:  Procedure(s) with comments: Right Lumbar 3-4 Lumbar 4-5 Anterolateral lumbar interbody fusion (Right) - Right Lumbar 3-4 Lumbar 4-5 Anterolateral lumbar interbody fusion  LUMBAR PERCUTANEOUS PEDICLE SCREW 2 LEVEL (N/A) with exploration of fusion and pedicle screw fixation L 3 - L 5 levels with posterolateral arthrodesis L 3 - S 1 levels  SURGEON:  Surgeon(s) and Role:    Erline Levine, MD - Primary  PHYSICIAN ASSISTANT: Kathyrn Sheriff, MD  ASSISTANTS: Poteat, RN   ANESTHESIA:   general  EBL:  100 mL   BLOOD ADMINISTERED:none  DRAINS: none   LOCAL MEDICATIONS USED:  MARCAINE    and LIDOCAINE   SPECIMEN:  No Specimen  DISPOSITION OF SPECIMEN:  N/A  COUNTS:  YES  TOURNIQUET:  * No tourniquets in log *  DICTATION: Patient is a 72 year old with severe spondylosis stenosis and scoliosis of the lumbar spine. It was elected to take him to surgery for anterolateral decompression and posterior pedicle screw fixation.  The patient has a TLIF at L 5 S 1..  Procedure: Patient was brought to the operating room and placed in a left lateral decubitus position on the operative table and using orthogonally projected C-arm fluoroscopy the patient was placed so that the L3-4 and L 45 levels were visualized in AP and lateral plane. The patient was then taped into position. The table was flexed so as to expose the L 45 level as the patient has a high iliac crest. Skin was marked along with a posterior finger dissection incision. His flank was then prepped and draped in usual sterile fashion and  incisions were made sequentially at L 45,  L 34. Posterior finger dissection was made to enter the retroperitoneal space and then subsequently the probe was inserted into the psoas muscle from the right side initially at the L 45 level. After mapping the neural elements were able to dock the probe per the midpoint of this vertebral level and without indications electrically of too close proximity to the neural tissues. Subsequently the self-retaining tractor was.after sequential dilators were utilized the shim was employed and the interspace was cleared of psoas muscle and then incised. A thorough discectomy was performed. Instruments were used to clear the interspace of disc material.An anterior entry with posterior trajectory was performed to avoid neural elements.   After thorough discectomy was performed and this was performed using AP and lateral fluoroscopy a 10 lordotic by 60 x 22 mm titanium implant was packed with extra small BMP and Attrax. This was tamped into position using the slides and its position was confirmed on AP and lateral fluoroscopy. Subsequently exposure was performed at the L3-4 level (the L 45 incision was used) and similar dissection was performed with locking of the self-retaining retractor. At this level were able to place a 10 lordotic by 22 x 55 mm implant packed in a similar fashion.  Hemostasis was assured the wounds were irrigated and closed with interrupted Vicryl sutures.  Sterile occlusive dressings were placed. Retractor times were:  L 45: 15 minutes;  L 34: 28 minutes..   Patient was then turned into a prone position on the [place] table.  Exposure was performed in the midline through previous incision.  Old hardware was inspected and removed.  There appeared to be solid arthrodesis at L 5 S1 with no screw loosening and good bridging bone.  New screws were placed at L 3 and L 4 and replacement screws were placed at L 5 using the same holes.  Using AP and lateral  fluoroscopy throughout this portion of the procedure, pedicle screws were placed using Reline Nuvasive screws. 2 screws were placed at L3 and (6.5 x 50 mm) and 2 at L4 (6.5 x 50),   2 at L5 (7.5 x 45). 80 mm rod was then affixed to the screw heads and locked down on the screws on the left and 80 mm rod on the right. All connections were then torqued. Posterior elements were decorticated and 60 cc allograft bone chips were packed in the posterolateral region (30 cc per side).  The wounds were irrigated and then closed with 1, 2-0 and 3-0 Vicryl stitches. Sterile occlusive dressing was placed with Dermabond. Long-acting Marcaine was injected. The patient was then extubated in the operating room and taken to recovery in stable and satisfactory condition having tolerated her operation well. Counts were correct at the end of the case.  Pelvic Parameters:  Preop: PT 22; PI 50; LL-30; PI-LL +21; SVA 46  PLAN OF CARE: Admit to inpatient   PATIENT DISPOSITION:  PACU - hemodynamically stable.   Delay start of Pharmacological VTE agent (>24hrs) due to surgical blood loss or risk of bleeding: yes

## 2017-10-07 NOTE — Anesthesia Postprocedure Evaluation (Signed)
Anesthesia Post Note  Patient: Michael Osborn  Procedure(s) Performed: Right Lumbar 3-4 Lumbar 4-5 Anterolateral lumbar interbody fusion (Right Spine Lumbar) LUMBAR PERCUTANEOUS PEDICLE SCREW 2 LEVEL (N/A )     Patient location during evaluation: PACU Anesthesia Type: General Level of consciousness: awake and alert Pain management: pain level controlled Vital Signs Assessment: post-procedure vital signs reviewed and stable Respiratory status: spontaneous breathing, nonlabored ventilation, respiratory function stable and patient connected to nasal cannula oxygen Cardiovascular status: blood pressure returned to baseline and stable Postop Assessment: no apparent nausea or vomiting Anesthetic complications: no    Last Vitals:  Vitals:   10/07/17 1430 10/07/17 1513  BP: 128/70 140/67  Pulse: 75 77  Resp: 16 17  Temp: 36.6 C   SpO2:  100%    Last Pain:  Vitals:   10/07/17 1513  TempSrc:   PainSc: 7                  Barnet [place]

## 2017-10-07 NOTE — Interval H&P Note (Signed)
History and Physical Interval Note:  10/07/2017 7:30 AM  Michael Osborn  has presented today for surgery, with the diagnosis of Lumbar stenosis without neurogenic claudication  The various methods of treatment have been discussed with the patient and family. After consideration of risks, benefits and other options for treatment, the patient has consented to  Procedure(s) with comments: Right Lumbar 3-4 Lumbar 4-5 Anterolateral lumbar interbody fusion with posterior pedicle screw fixation/exploration Lumbar 5 Sacral 1 fusion with removal of hardware (Right) - Right Lumbar 3-4 Lumbar 4-5 Anterolateral lumbar interbody fusion with posterior pedicle screw fixation/exploration Lumbar 5 Sacral 1 fusion with removal of hardware LUMBAR PERCUTANEOUS PEDICLE SCREW 2 LEVEL (N/A) as a surgical intervention .  The patient's history has been reviewed, patient examined, no change in status, stable for surgery.  I have reviewed the patient's chart and labs.  Questions were answered to the patient's satisfaction.     Peggyann Shoals

## 2017-10-07 NOTE — Progress Notes (Signed)
Dr houser at bedside pt stillc/of severe pain in back dr houser gave precedix 61mcgiv

## 2017-10-07 NOTE — Transfer of Care (Signed)
Immediate Anesthesia Transfer of Care Note  Patient: Michael Osborn  Procedure(s) Performed: Right Lumbar 3-4 Lumbar 4-5 Anterolateral lumbar interbody fusion (Right Spine Lumbar) LUMBAR PERCUTANEOUS PEDICLE SCREW 2 LEVEL (N/A )  Patient Location: PACU  Anesthesia Type:General  Level of Consciousness: awake, alert  and oriented  Airway & Oxygen Therapy: Patient Spontanous Breathing and Patient connected to nasal cannula oxygen  Post-op Assessment: Report given to RN, Post -op Vital signs reviewed and stable and Patient moving all extremities  Post vital signs: Reviewed and stable  Last Vitals:  Vitals Value Taken Time  BP 135/86 10/07/2017 12:40 PM  Temp 36.5 C 10/07/2017 12:37 PM  Pulse 89 10/07/2017 12:44 PM  Resp 17 10/07/2017 12:44 PM  SpO2 98 % 10/07/2017 12:44 PM  Vitals shown include unvalidated device data.  Last Pain:  Vitals:   10/07/17 1237  TempSrc:   PainSc: 0-No pain         Complications: No apparent anesthesia complications

## 2017-10-07 NOTE — Op Note (Signed)
10/07/2017  12:40 PM  PATIENT:  Michael Osborn  72 y.o. male  PRE-OPERATIVE DIAGNOSIS:  Lumbar stenosis without neurogenic claudication, lumbar scoliosis, lumbar spondylolisthesis, lumbar foraminal stenosis, lumbago, radiculopathy  POST-OPERATIVE DIAGNOSIS: Lumbar stenosis without neurogenic claudication, lumbar scoliosis, lumbar spondylolisthesis, lumbar foraminal stenosis, lumbago, radiculopathy  PROCEDURE:  Procedure(s) with comments: Right Lumbar 3-4 Lumbar 4-5 Anterolateral lumbar interbody fusion (Right) - Right Lumbar 3-4 Lumbar 4-5 Anterolateral lumbar interbody fusion  LUMBAR PERCUTANEOUS PEDICLE SCREW 2 LEVEL (N/A) with exploration of fusion and pedicle screw fixation L 3 - L 5 levels with posterolateral arthrodesis L 3 - S 1 levels  SURGEON:  Surgeon(s) and Role:    Erline Levine, MD - Primary  PHYSICIAN ASSISTANT: Kathyrn Sheriff, MD  ASSISTANTS: Poteat, RN   ANESTHESIA:   general  EBL:  100 mL   BLOOD ADMINISTERED:none  DRAINS: none   LOCAL MEDICATIONS USED:  MARCAINE    and LIDOCAINE   SPECIMEN:  No Specimen  DISPOSITION OF SPECIMEN:  N/A  COUNTS:  YES  TOURNIQUET:  * No tourniquets in log *  DICTATION: Patient is a 72 year old with severe spondylosis stenosis and scoliosis of the lumbar spine. It was elected to take him to surgery for anterolateral decompression and posterior pedicle screw fixation.  The patient has a TLIF at L 5 S 1..  Procedure: Patient was brought to the operating room and placed in a left lateral decubitus position on the operative table and using orthogonally projected C-arm fluoroscopy the patient was placed so that the L3-4 and L 45 levels were visualized in AP and lateral plane. The patient was then taped into position. The table was flexed so as to expose the L 45 level as the patient has a high iliac crest. Skin was marked along with a posterior finger dissection incision. His flank was then prepped and draped in usual sterile fashion and  incisions were made sequentially at L 45,  L 34. Posterior finger dissection was made to enter the retroperitoneal space and then subsequently the probe was inserted into the psoas muscle from the right side initially at the L 45 level. After mapping the neural elements were able to dock the probe per the midpoint of this vertebral level and without indications electrically of too close proximity to the neural tissues. Subsequently the self-retaining tractor was.after sequential dilators were utilized the shim was employed and the interspace was cleared of psoas muscle and then incised. A thorough discectomy was performed. Instruments were used to clear the interspace of disc material.An anterior entry with posterior trajectory was performed to avoid neural elements.   After thorough discectomy was performed and this was performed using AP and lateral fluoroscopy a 10 lordotic by 60 x 22 mm titanium implant was packed with extra small BMP and Attrax. This was tamped into position using the slides and its position was confirmed on AP and lateral fluoroscopy. Subsequently exposure was performed at the L3-4 level (the L 45 incision was used) and similar dissection was performed with locking of the self-retaining retractor. At this level were able to place a 10 lordotic by 22 x 55 mm implant packed in a similar fashion.  Hemostasis was assured the wounds were irrigated and closed with interrupted Vicryl sutures.  Sterile occlusive dressings were placed. Retractor times were:  L 45: 15 minutes;  L 34: 28 minutes..   Patient was then turned into a prone position on the [place] table.  Exposure was performed in the midline through previous incision.  Old hardware was inspected and removed.  There appeared to be solid arthrodesis at L 5 S1 with no screw loosening and good bridging bone.  New screws were placed at L 3 and L 4 and replacement screws were placed at L 5 using the same holes.  Using AP and lateral  fluoroscopy throughout this portion of the procedure, pedicle screws were placed using Reline Nuvasive screws. 2 screws were placed at L3 and (6.5 x 50 mm) and 2 at L4 (6.5 x 50),   2 at L5 (7.5 x 45). 80 mm rod was then affixed to the screw heads and locked down on the screws on the left and 80 mm rod on the right. All connections were then torqued. Posterior elements were decorticated and 60 cc allograft bone chips were packed in the posterolateral region (30 cc per side).  The wounds were irrigated and then closed with 1, 2-0 and 3-0 Vicryl stitches. Sterile occlusive dressing was placed with Dermabond. Long-acting Marcaine was injected. The patient was then extubated in the operating room and taken to recovery in stable and satisfactory condition having tolerated her operation well. Counts were correct at the end of the case.  Pelvic Parameters:  Preop: PT 22; PI 50; LL-30; PI-LL +21; SVA 46  PLAN OF CARE: Admit to inpatient   PATIENT DISPOSITION:  PACU - hemodynamically stable.   Delay start of Pharmacological VTE agent (>24hrs) due to surgical blood loss or risk of bleeding: yes

## 2017-10-07 NOTE — Anesthesia Procedure Notes (Signed)
Procedure Name: Intubation Date/Time: 10/07/2017 7:52 AM Performed by: Carys Malina T, CRNA Pre-anesthesia Checklist: Patient identified, Emergency Drugs available, Suction available and Patient being monitored Patient Re-evaluated:Patient Re-evaluated prior to induction Oxygen Delivery Method: Circle system utilized Preoxygenation: Pre-oxygenation with 100% oxygen Induction Type: IV induction Ventilation: Mask ventilation without difficulty Laryngoscope Size: Miller and 3 Grade View: Grade II Tube type: Oral Tube size: 7.5 mm Number of attempts: 1 Airway Equipment and Method: Patient positioned with wedge pillow and Stylet Placement Confirmation: ETT inserted through vocal cords under direct vision,  positive ETCO2 and breath sounds checked- equal and bilateral Secured at: 22 cm Tube secured with: Tape Dental Injury: Teeth and Oropharynx as per pre-operative assessment

## 2017-10-07 NOTE — Progress Notes (Signed)
Report given to lisa rn as caregiver

## 2017-10-08 ENCOUNTER — Encounter (HOSPITAL_COMMUNITY): Payer: Self-pay

## 2017-10-08 MED ORDER — OXYCODONE HCL 5 MG PO TABS: 5.0000 mg | ORAL_TABLET | ORAL | Status: DC | PRN

## 2017-10-08 MED ORDER — OXYCODONE HCL ER 10 MG PO T12A
10.0000 mg | EXTENDED_RELEASE_TABLET | Freq: Two times a day (BID) | ORAL | Status: DC
  Administered 2017-10-08 – 2017-10-09 (×3): 10 mg via ORAL
  Filled 2017-10-08 (×3): qty 1

## 2017-10-08 MED ORDER — METHOCARBAMOL 500 MG PO TABS: 750.0000 mg | ORAL_TABLET | Freq: Four times a day (QID) | ORAL | Status: DC | PRN

## 2017-10-08 MED ORDER — METHOCARBAMOL 1000 MG/10ML IJ SOLN
750.0000 mg | Freq: Four times a day (QID) | INTRAVENOUS | Status: DC | PRN
  Filled 2017-10-08: qty 7.5

## 2017-10-08 NOTE — Progress Notes (Signed)
Pt demonstrated ability to perform mobility modified independently observing back precautions. Unable to access feet for ADL. Verbally instructed in availability of AE, will practice next session.   10/08/17 1200  OT Visit Information  Last OT Received On 10/08/17  Assistance Needed +1  History of Present Illness pt is a 72 y/o male with h/o HTN, BPH, rot. cuff repair, admitted for elective Lumbar fusion surgery at L3-4, L 4-5.  Precautions  Precautions Back  Precaution Booklet Issued Yes (comment)  Restrictions  Other Position/Activity Restrictions per pt, brace is at home  [place] expects to be discharged to: Private residence  Living Arrangements Spouse/significant other  Available Help at Discharge Family  Type of [place] to enter  Entrance Stairs-Number of Steps 3  Entrance Stairs-Rails [place] Two level;Able to live on main level with bedroom/bathroom  Alternate Level Stairs-Number of Steps 14  Alternate Level Stairs-Rails Can reach both  Barrister's clerk None  Prior Function  Level of Independence Independent  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  Communication  Communication No difficulties  Pain Assessment  Pain Assessment Faces  Faces Pain Scale 6  Pain Location back  Pain Descriptors / Indicators Guarding;Grimacing  Pain Intervention(s) Monitored during session;Repositioned;Premedicated before session;Ice applied  Cognition  Arousal/Alertness Awake/alert  Behavior During Therapy WFL for tasks assessed/performed  Overall Cognitive Status Within Functional Limits for tasks assessed  Upper Extremity Assessment  Upper Extremity Assessment Overall WFL for tasks assessed  Lower Extremity Assessment  Lower Extremity Assessment Overall WFL for tasks assessed  ADL  Overall ADL's  Needs assistance/impaired  Eating/Feeding Independent;Sitting   Grooming Modified independent;Standing  Grooming Details (indicate cue type and reason) educated on two cup method for oral care, use of washcloth for face  Upper Body Bathing Set up;Sitting  Upper Body Bathing Details (indicate cue type and reason) recommended long handled bath sponge for back  Lower Body Bathing Minimal assistance;Sit to/from stand  Lower Body Bathing Details (indicate cue type and reason) recommended long handled bath sponge, reacher  Upper Body Dressing  Set up;Sitting  Lower Body Dressing Minimal assistance;Sit to/from stand  Lower Body Dressing Details (indicate cue type and reason) recommended sock aid and reacher  Toilet Transfer Modified Independent;Ambulation  Toileting- Clothing Manipulation and Hygiene Modified independent (standing)  Functional mobility during ADLs Modified independent  General ADL Comments educated in back precautions related to IADL and to avoid sitting >30 minutes for first 2 weeks  Bed Mobility  Overal bed mobility Modified Independent  General bed mobility comments used log roll technique as instructed  Transfers  Overall transfer level Modified independent  Equipment used None  General transfer comment slow to rise, but no assistance from bed  Balance  Standing balance-Leahy Scale Fair  OT - End of Session  Equipment Utilized During Treatment Gait belt  Activity Tolerance Patient tolerated treatment well  Patient left in bed;with call bell/phone within reach  OT Assessment  OT Recommendation/Assessment Patient needs continued OT Services  OT Visit Diagnosis Pain  OT Problem List Pain  OT Plan  OT Frequency (ACUTE ONLY) Min 2X/week  OT Treatment/Interventions (ACUTE ONLY) Self-care/ADL training;Patient/family education;DME and/or AE instruction  AM-PAC OT "6 Clicks" Daily Activity Outcome Measure  Help from another person eating meals? 4  Help from another person taking care of personal grooming? 4  Help from another person  toileting, which includes using toliet, bedpan, or  urinal? 4  Help from another person bathing (including washing, rinsing, drying)? 3  Help from another person to put on and taking off regular upper body clothing? 4  Help from another person to put on and taking off regular lower body clothing? 3  6 Click Score 22  ADL G Code Conversion CJ  OT Recommendation  Follow Up Recommendations No OT follow up  OT Equipment None recommended by OT  Individuals Consulted  Consulted and Agree with Results and Recommendations Patient  Acute Rehab OT Goals  Patient Stated Goal home  OT Goal Formulation With patient  Time For Goal Achievement 10/15/17  Potential to Achieve Goals Good  OT Time Calculation  OT Start Time (ACUTE ONLY) 1152  OT Stop Time (ACUTE ONLY) 1215  OT Time Calculation (min) 23 min  OT General Charges  $OT Visit 1 Visit  OT Evaluation  $OT Eval Low Complexity 1 Low  OT Treatments  $Self Care/Home Management  8-22 mins  Written Expression  Dominant Hand Right  Nestor Lewandowsky, OTR/L Acute Rehabilitation Services Pager: [phone] Office: [phone]

## 2017-10-08 NOTE — Progress Notes (Signed)
Neurosurgery Progress Note  No issues overnight. Complains of severe back pain requiring morphine q 2 hours Voiding normal and tolerating po  EXAM:  BP (!) 148/81 (BP Location: Right Arm)   Pulse 98   Temp 98 F (36.7 C) (Oral)   Resp 18   SpO2 100%   Awake, alert, oriented  Speech fluent, appropriate  CN grossly intact  MAEW with good strength. Mild right hip flexor weakness not unexpected Incisions c/d/i  PLAN Doing fair this am Significant pain requiring consistent morphine. Will make adjustments to hopefully avoid IV pain meds    - Scheduled Oxycontin 10mg  ER BID    - Increased robaxin to 750mg  q 6     - Increase Oxycodone IR to 10mg  q 3 hours prn Work with therapy today Hopeful for discharge tomorrow

## 2017-10-08 NOTE — Care Management Note (Signed)
Case Management Note  Patient Details  Name: TRUST LEH MRN: [id number] Date of Birth: October 06, 1945  Subjective/Objective:        Pt from home with spouse/family who can provide assistance as needed.  PT/OT recommends no f/u or DME.              Action/Plan: No CM services needed at this time.   Expected Discharge Date:                  Expected Discharge Plan:  Home/Self Care  In-House Referral:  NA  Discharge planning Services  CM Consult  Post Acute Care Choice:  NA Choice offered to:     DME Arranged:    DME Agency:     HH Arranged:    HH Agency:     Status of Service:     If discussed at [employer] of Stay Meetings, dates discussed:    Additional Comments:  Claudie Leach, RN 10/08/2017, 12:44 PM

## 2017-10-08 NOTE — Evaluation (Signed)
Physical Therapy Evaluation Patient Details Name: Michael Osborn MRN: [id number] DOB: 19-Nov-1945 Today's Date: 10/08/2017   History of Present Illness  pt is a 72 y/o male with h/o HTN, BPH, rot. cuff repair, admitted for elective Lumbar fusion surgery at L3-4, L 4-5.  Clinical Impression  Pt admitted with/for elective lumbar fusion surgery.  Pt is progressing well, needing min guard to supervision overall.  Pt currently limited functionally due to the problems listed below.  (see problems list.)  Pt will benefit from PT to maximize function and safety to be able to get home safely with available assist.     Follow Up Recommendations No PT follow up;Supervision - Intermittent    Equipment Recommendations  None recommended by PT    Recommendations for Other Services       Precautions / Restrictions Precautions Precautions: Back      Mobility  Bed Mobility Overal bed mobility: Needs Assistance Bed Mobility: Rolling;Sidelying to Sit;Sit to Sidelying Rolling: Min guard Sidelying to sit: Min guard     Sit to sidelying: Supervision General bed mobility comments: discussed technique and practiced without assist  Transfers Overall transfer level: Needs assistance Equipment used: None Transfers: Sit to/from Stand Sit to Stand: Supervision            Ambulation/Gait Ambulation/Gait assistance: Min guard Gait Distance (Feet): 70 Feet Assistive device: None Gait Pattern/deviations: Step-through pattern   Gait velocity interpretation: <1.8 ft/sec, indicate of risk for recurrent falls General Gait Details: generally steady, guarded and mildly antalgic  Stairs            Wheelchair Mobility    Modified Rankin (Stroke Patients Only)       Balance Overall balance assessment: Needs assistance Sitting-balance support: No upper extremity supported Sitting balance-Leahy Scale: Good       Standing balance-Leahy Scale: Fair                                Pertinent Vitals/Pain Pain Assessment: Faces Faces Pain Scale: Hurts little more Pain Location: back Pain Descriptors / Indicators: Guarding;Grimacing Pain Intervention(s): Monitored during session    Home Living Family/patient expects to be discharged to:: Private residence Living Arrangements: Spouse/significant other Available Help at Discharge: Family Type of Home: House Home Access: Stairs to enter Entrance Stairs-Rails: Psychiatric nurse of Steps: 3 Home Layout: Two level;Able to live on main level with bedroom/bathroom Home Equipment: None      Prior Function Level of Independence: Independent               Hand Dominance   Dominant Hand: Right    Extremity/Trunk Assessment   Upper Extremity Assessment Upper Extremity Assessment: Overall WFL for tasks assessed    Lower Extremity Assessment Lower Extremity Assessment: Overall WFL for tasks assessed       Communication   Communication: No difficulties  Cognition Arousal/Alertness: Awake/alert Behavior During Therapy: WFL for tasks assessed/performed Overall Cognitive Status: Within Functional Limits for tasks assessed                                        General Comments General comments (skin integrity, edema, etc.): pt instructed in back care/prec, log roll/transitions to/from sitting, lifting restrictions, bracing issues, progression of activity.    Exercises     Assessment/Plan    PT Assessment Patient needs continued  PT services  PT Problem List Decreased activity tolerance;Decreased mobility;Decreased knowledge of precautions;Pain       PT Treatment Interventions Gait training;Stair training;Functional mobility training;Therapeutic activities;Patient/family education    PT Goals (Current goals can be found in the Care Plan section)  Acute Rehab PT Goals Patient Stated Goal: home PT Goal Formulation: With patient Time For Goal Achievement:  10/10/17 Potential to Achieve Goals: Good    Frequency Min 5X/week   Barriers to discharge        Co-evaluation               AM-PAC PT "6 Clicks" Daily Activity  Outcome Measure Difficulty turning over in bed (including adjusting bedclothes, sheets and blankets)?: A Little Difficulty moving from lying on back to sitting on the side of the bed? : A Little Difficulty sitting down on and standing up from a chair with arms (e.g., wheelchair, bedside commode, etc,.)?: A Little Help needed moving to and from a bed to chair (including a wheelchair)?: A Little Help needed walking in hospital room?: A Little Help needed climbing 3-5 steps with a railing? : A Little 6 Click Score: 18    End of Session   Activity Tolerance: Patient tolerated treatment well Patient left: in bed;with call bell/phone within reach Nurse Communication: Mobility status PT Visit Diagnosis: Other abnormalities of gait and mobility (R26.89);Pain Pain - part of body: (back  left hip)    Time: 2518-9842 PT Time Calculation (min) (ACUTE ONLY): 21 min   Charges:   PT Evaluation $PT Eval Low Complexity: 1 Low          10/08/2017  Donnella Sham, PT Acute Rehabilitation Services [phone]  (pager) [phone]  (office)  Tessie Fass Zonia Caplin 10/08/2017, 11:37 AM

## 2017-10-09 MED ORDER — OXYCODONE-ACETAMINOPHEN 7.5-325 MG PO TABS: 1.0000 | ORAL_TABLET | ORAL | 0 refills | Status: AC | PRN

## 2017-10-09 MED ORDER — FAMOTIDINE 20 MG PO TABS
20.0000 mg | ORAL_TABLET | Freq: Two times a day (BID) | ORAL | Status: DC
  Administered 2017-10-09: 20 mg via ORAL

## 2017-10-09 MED ORDER — METHOCARBAMOL 750 MG PO TABS: 750.0000 mg | ORAL_TABLET | Freq: Three times a day (TID) | ORAL | 1 refills | Status: AC | PRN

## 2017-10-09 NOTE — Progress Notes (Signed)
Physical Therapy Treatment Patient Details Name: Michael Osborn MRN: [id number] DOB: 19-Jul-1945 Today's Date: 10/09/2017    History of Present Illness pt is a 72 y/o male with h/o HTN, BPH, rot. cuff repair, admitted for elective Lumbar fusion surgery at L3-4, L 4-5.    PT Comments    Pt progressing very well. Pt able to amb and complete stair negotiation without difficulties. Pt with c/o L sided low back pain limiting ability to get comfortable. Pt states he's never had pain there before. Acute PT to cont to follow.   Follow Up Recommendations  No PT follow up;Supervision - Intermittent     Equipment Recommendations  None recommended by PT    Recommendations for Other Services       Precautions / Restrictions Precautions Precautions: Back Precaution Booklet Issued: Yes (comment) Precaution Comments: pt able to recall 3/3 Restrictions Weight Bearing Restrictions: No    Mobility  Bed Mobility               General bed mobility comments: pt sitting up at EOB upon PT arrival  Transfers Overall transfer level: Modified independent Equipment used: None Transfers: Sit to/from Stand Sit to Stand: Modified independent (Device/Increase time)         General transfer comment: minimal bending, no difficulty  Ambulation/Gait Ambulation/Gait assistance: Supervision Gait Distance (Feet): 200 Feet Assistive device: None Gait Pattern/deviations: WFL(Within Functional Limits)(appropriate for just having surgery)   Gait velocity interpretation: 1.31 - 2.62 ft/sec, indicative of limited community ambulator General Gait Details: mildly guarded due to pain from surgery   Stairs Stairs: Yes Stairs assistance: Supervision Stair Management: One rail Right Number of Stairs: 4(to mimic home set up) General stair comments: no difficulty   Wheelchair Mobility    Modified Rankin (Stroke Patients Only)       Balance Overall balance assessment: Mild deficits observed,  not formally tested                                          Cognition Arousal/Alertness: Awake/alert Behavior During Therapy: WFL for tasks assessed/performed Overall Cognitive Status: Within Functional Limits for tasks assessed                                        Exercises      General Comments General comments (skin integrity, edema, etc.): pt c/o pain in L side of back, pt states hes never had pain there before      Pertinent Vitals/Pain Pain Assessment: 0-10 Pain Score: 8  Pain Location: back Pain Descriptors / Indicators: Guarding Pain Intervention(s): Monitored during session    Home Living                      Prior Function            PT Goals (current goals can now be found in the care plan section) Acute Rehab PT Goals Patient Stated Goal: home today Progress towards PT goals: Progressing toward goals    Frequency    Min 5X/week      PT Plan Current plan remains appropriate    Co-evaluation              AM-PAC PT "6 Clicks" Daily Activity  Outcome Measure  Difficulty turning over in bed (  including adjusting bedclothes, sheets and blankets)?: None Difficulty moving from lying on back to sitting on the side of the bed? : None Difficulty sitting down on and standing up from a chair with arms (e.g., wheelchair, bedside commode, etc,.)?: None Help needed moving to and from a bed to chair (including a wheelchair)?: None Help needed walking in hospital room?: None Help needed climbing 3-5 steps with a railing? : A Little 6 Click Score: 23    End of Session   Activity Tolerance: Patient tolerated treatment well Patient left: in chair;with call bell/phone within reach Nurse Communication: Mobility status PT Visit Diagnosis: Other abnormalities of gait and mobility (R26.89);Pain Pain - part of body: (back)     Time: 3235-5732 PT Time Calculation (min) (ACUTE ONLY): 19 min  Charges:  $Gait  Training: 8-22 mins                     Kittie Plater, PT, DPT Acute Rehabilitation Services Pager #: [phone] Office #: [phone]    Berline Lopes 10/09/2017, 11:22 AM

## 2017-10-09 NOTE — Progress Notes (Signed)
Pt able to recall back precautions. Performed toilet and simulated shower transfers modified independently. Able to don shorts without bending, but needs assist for socks, wife will help. Educated in safe footwear and reinforced back precautions during IADL. Pt and wife verbalizing understanding. No further OT needs.   10/09/17 1140  OT Visit Information  Last OT Received On 10/09/17  Assistance Needed +1  History of Present Illness pt is a 72 y/o male with h/o HTN, BPH, rot. cuff repair, admitted for elective Lumbar fusion surgery at L3-4, L 4-5.  Precautions  Precautions Back  Precaution Comments pt able to recall 3/3  Required Braces or Orthoses Spinal Brace  Spinal Brace Lumbar corset;Applied in sitting position  Pain Assessment  Pain Assessment Faces  Faces Pain Scale 8  Pain Location back  Pain Descriptors / Indicators Guarding  Pain Intervention(s) Ice applied  Cognition  Arousal/Alertness Awake/alert  Behavior During Therapy WFL for tasks assessed/performed  Overall Cognitive Status Within Functional Limits for tasks assessed  ADL  Overall ADL's  Needs assistance/impaired  Upper Body Bathing Details (indicate cue type and reason) recommended long handled bath sponge for back  Lower Body Dressing Modified independent;Sit to/from stand;Moderate assistance  Lower Body Dressing Details (indicate cue type and reason) donned shorts without assist and flip flops, assist needed for socks, pt's wife will help  Toilet Transfer Modified Independent;Ambulation  Tub/ Chemical engineer;Modified independent;Ambulation  Functional mobility during ADLs Modified independent  General ADL Comments educated in safe footwear  Bed Mobility  General bed mobility comments pt sitting up in chair upon arrival  Transfers  Overall transfer level Modified independent  Equipment used None  General transfer comment slow  OT - End of Session  Activity Tolerance Patient tolerated treatment  well  Patient left  (walking in room with wife)  OT Assessment/Plan  OT Plan All goals met and education completed, patient discharged from OT services  OT Visit Diagnosis Pain  Follow Up Recommendations No OT follow up  OT Equipment None recommended by OT  AM-PAC OT "6 Clicks" Daily Activity Outcome Measure  Help from another person eating meals? 4  Help from another person taking care of personal grooming? 4  Help from another person toileting, which includes using toliet, bedpan, or urinal? 4  Help from another person bathing (including washing, rinsing, drying)? 4  Help from another person to put on and taking off regular upper body clothing? 4  Help from another person to put on and taking off regular lower body clothing? 3  6 Click Score 23  ADL G Code Conversion CI  OT Goal Progression  Progress towards OT goals Goals met/education completed, patient discharged from OT  Acute Rehab OT Goals  Patient Stated Goal home today  OT Time Calculation  OT Start Time (ACUTE ONLY) 1110  OT Stop Time (ACUTE ONLY) 1131  OT Time Calculation (min) 21 min  OT General Charges  $OT Visit 1 Visit  OT Treatments  $Self Care/Home Management  8-22 mins  Nestor Lewandowsky, OTR/L [place] Pager: [phone] Office: [phone]

## 2017-10-09 NOTE — Progress Notes (Signed)
Doing well this am Pain well controlled Worked with therapy Strength normal Eager for d/c. Orders signed.

## 2017-10-09 NOTE — Progress Notes (Signed)
Pt with d/c orders. IV removed. Discharge instructions reviewed with pt and all questions answered. Printed prescriptions with pt at time of d/c. Pt escorted to vehicle via wheelchair.

## 2017-10-09 NOTE — Discharge Summary (Signed)
Physician Discharge Summary  Patient ID: SIR MALLIS MRN: [id number] DOB/AGE: 71-Apr-1947 71 y.o.  Admit date: 10/07/2017 Discharge date: 10/09/2017  Admission Diagnoses:  Lumbar scoliosis  Discharge Diagnoses:  Same Active Problems:   Lumbar scoliosis  Discharged Condition: Stable  Hospital Course:  Michael Osborn is a 72 y.o. male who was admitted for the below procedure. There were no post operative complications. At time of discharge, pain was well controlled, ambulating with Pt/OT, tolerating po, voiding normal. Ready for discharge.  Treatments: Surgery Right Lumbar 3-4 Lumbar 4-5 Anterolateral lumbar interbody fusion (Right) - Right Lumbar 3-4 Lumbar 4-5 Anterolateral lumbar interbody fusion  LUMBAR PERCUTANEOUS PEDICLE SCREW 2 LEVEL (N/A) with exploration of fusion and pedicle screw fixation L 3 - L 5 levels with posterolateral arthrodesis L 3 - S 1 levels  Discharge Exam: Blood pressure (!) 119/103, pulse 68, temperature 98.6 F (37 C), resp. rate 18, SpO2 99 %. Awake, alert, oriented Speech fluent, appropriate CN grossly intact 5/5 BUE/BLE Wound c/d/i  Disposition: Discharge disposition: 01-Home or Self Care       Discharge Instructions    Call MD for:  difficulty breathing, headache or visual disturbances   Complete by:  As directed    Call MD for:  persistant dizziness or light-headedness   Complete by:  As directed    Call MD for:  redness, tenderness, or signs of infection (pain, swelling, redness, odor or green/yellow discharge around incision site)   Complete by:  As directed    Call MD for:  severe uncontrolled pain   Complete by:  As directed    Call MD for:  temperature >100.4   Complete by:  As directed    Diet general   Complete by:  As directed    Driving Restrictions   Complete by:  As directed    Do not drive until given clearance.   Increase activity slowly   Complete by:  As directed    Lifting restrictions   Complete by:  As  directed    Do not lift anything >10lbs. Avoid bending and twisting in awkward positions. Avoid bending at the back.   May shower / Bathe   Complete by:  As directed    In 24 hours. Okay to wash wound with warm soapy water. Avoid scrubbing the wound. Pat dry.   Remove dressing in 24 hours   Complete by:  As directed      Allergies as of 10/09/2017   No Known Allergies     Medication List    STOP taking these medications   traMADol 50 MG tablet Commonly known as:  ULTRAM     TAKE these medications   ALLERGY EYE DROPS OP Place 1 drop into both eyes daily as needed (allergies).   aspirin-sod bicarb-citric acid 325 MG Tbef tablet Commonly known as:  ALKA-SELTZER Take 650 mg by mouth daily as needed (indigestion).   atorvastatin 40 MG tablet Commonly known as:  LIPITOR Take 40 mg by mouth daily.   diclofenac 75 MG EC tablet Commonly known as:  VOLTAREN Take 75 mg by mouth daily. May take a second 75 mg dose as needed for pain   Fiber 0.52 g Caps Take 1.04 g by mouth daily.   FISH OIL PO Take 2 capsules by mouth daily.   lisinopril 40 MG tablet Commonly known as:  PRINIVIL,ZESTRIL Take 40 mg by mouth daily.   methocarbamol 750 MG tablet Commonly known as:  ROBAXIN Take 1 tablet (750  mg total) by mouth 3 (three) times daily as needed for muscle spasms.   oxyCODONE-acetaminophen 7.5-325 MG tablet Commonly known as:  PERCOCET Take 1 tablet by mouth every 4 (four) hours as needed.   oxymetazoline 0.05 % nasal spray Commonly known as:  AFRIN Place 1 spray into both nostrils daily as needed for congestion.   pseudoephedrine 120 MG 12 hr tablet Commonly known as:  SUDAFED Take 240 mg by mouth daily as needed for congestion.   tamsulosin 0.4 MG Caps capsule Commonly known as:  FLOMAX Take 0.4 mg by mouth 2 (two) times a week.      Follow-up Information    Erline Levine, MD Follow up.   Specialty:  Neurosurgery Contact information: [address]  [address] [place] [address] [phone]           Signed: Traci Sermon 10/09/2017, 9:37 AM

## 2017-10-10 ENCOUNTER — Encounter (HOSPITAL_COMMUNITY): Payer: Self-pay | Admitting: Neurosurgery

## 2017-10-10 MED FILL — Heparin Sodium (Porcine) Inj 1000 Unit/ML: INTRAMUSCULAR | Qty: 30 | Status: AC

## 2017-10-10 MED FILL — Thrombin (Recombinant) For Soln 20000 Unit: CUTANEOUS | Qty: 1 | Status: AC

## 2017-10-10 MED FILL — Sodium Chloride IV Soln 0.9%: INTRAVENOUS | Qty: 1000 | Status: AC

## 2017-10-17 DIAGNOSIS — J3081 Allergic rhinitis due to animal (cat) (dog) hair and dander: Secondary | ICD-10-CM | POA: Diagnosis not present

## 2017-10-17 DIAGNOSIS — J3089 Other allergic rhinitis: Secondary | ICD-10-CM | POA: Diagnosis not present

## 2017-10-20 DIAGNOSIS — R972 Elevated prostate specific antigen [PSA]: Secondary | ICD-10-CM | POA: Diagnosis not present

## 2017-10-21 DIAGNOSIS — J3089 Other allergic rhinitis: Secondary | ICD-10-CM | POA: Diagnosis not present

## 2017-10-21 DIAGNOSIS — J3081 Allergic rhinitis due to animal (cat) (dog) hair and dander: Secondary | ICD-10-CM | POA: Diagnosis not present

## 2017-10-24 DIAGNOSIS — M5416 Radiculopathy, lumbar region: Secondary | ICD-10-CM | POA: Diagnosis not present

## 2017-11-07 DIAGNOSIS — J3081 Allergic rhinitis due to animal (cat) (dog) hair and dander: Secondary | ICD-10-CM | POA: Diagnosis not present

## 2017-11-07 DIAGNOSIS — J3089 Other allergic rhinitis: Secondary | ICD-10-CM | POA: Diagnosis not present

## 2017-11-15 DIAGNOSIS — J3089 Other allergic rhinitis: Secondary | ICD-10-CM | POA: Diagnosis not present

## 2017-11-15 DIAGNOSIS — J301 Allergic rhinitis due to pollen: Secondary | ICD-10-CM | POA: Diagnosis not present

## 2017-11-25 DIAGNOSIS — J3081 Allergic rhinitis due to animal (cat) (dog) hair and dander: Secondary | ICD-10-CM | POA: Diagnosis not present

## 2017-11-25 DIAGNOSIS — J3089 Other allergic rhinitis: Secondary | ICD-10-CM | POA: Diagnosis not present

## 2017-11-28 DIAGNOSIS — M545 Low back pain: Secondary | ICD-10-CM | POA: Diagnosis not present

## 2017-11-28 DIAGNOSIS — M5126 Other intervertebral disc displacement, lumbar region: Secondary | ICD-10-CM | POA: Diagnosis not present

## 2017-11-28 DIAGNOSIS — M4316 Spondylolisthesis, lumbar region: Secondary | ICD-10-CM | POA: Diagnosis not present

## 2017-11-28 DIAGNOSIS — I10 Essential (primary) hypertension: Secondary | ICD-10-CM | POA: Diagnosis not present

## 2017-11-28 DIAGNOSIS — M4126 Other idiopathic scoliosis, lumbar region: Secondary | ICD-10-CM | POA: Diagnosis not present

## 2017-11-28 DIAGNOSIS — Z6827 Body mass index (BMI) 27.0-27.9, adult: Secondary | ICD-10-CM | POA: Diagnosis not present

## 2017-11-28 DIAGNOSIS — M48061 Spinal stenosis, lumbar region without neurogenic claudication: Secondary | ICD-10-CM | POA: Diagnosis not present

## 2017-11-29 DIAGNOSIS — M545 Low back pain: Secondary | ICD-10-CM | POA: Diagnosis not present

## 2017-11-29 DIAGNOSIS — M6281 Muscle weakness (generalized): Secondary | ICD-10-CM | POA: Diagnosis not present

## 2017-11-29 DIAGNOSIS — R262 Difficulty in walking, not elsewhere classified: Secondary | ICD-10-CM | POA: Diagnosis not present

## 2017-12-01 DIAGNOSIS — R262 Difficulty in walking, not elsewhere classified: Secondary | ICD-10-CM | POA: Diagnosis not present

## 2017-12-01 DIAGNOSIS — M6281 Muscle weakness (generalized): Secondary | ICD-10-CM | POA: Diagnosis not present

## 2017-12-01 DIAGNOSIS — M545 Low back pain: Secondary | ICD-10-CM | POA: Diagnosis not present

## 2017-12-06 DIAGNOSIS — M6281 Muscle weakness (generalized): Secondary | ICD-10-CM | POA: Diagnosis not present

## 2017-12-06 DIAGNOSIS — R262 Difficulty in walking, not elsewhere classified: Secondary | ICD-10-CM | POA: Diagnosis not present

## 2017-12-06 DIAGNOSIS — M5416 Radiculopathy, lumbar region: Secondary | ICD-10-CM | POA: Diagnosis not present

## 2017-12-06 DIAGNOSIS — M545 Low back pain: Secondary | ICD-10-CM | POA: Diagnosis not present

## 2017-12-13 DIAGNOSIS — M545 Low back pain: Secondary | ICD-10-CM | POA: Diagnosis not present

## 2017-12-13 DIAGNOSIS — R262 Difficulty in walking, not elsewhere classified: Secondary | ICD-10-CM | POA: Diagnosis not present

## 2017-12-13 DIAGNOSIS — M5416 Radiculopathy, lumbar region: Secondary | ICD-10-CM | POA: Diagnosis not present

## 2017-12-13 DIAGNOSIS — M6281 Muscle weakness (generalized): Secondary | ICD-10-CM | POA: Diagnosis not present

## 2017-12-15 DIAGNOSIS — M545 Low back pain: Secondary | ICD-10-CM | POA: Diagnosis not present

## 2017-12-15 DIAGNOSIS — R262 Difficulty in walking, not elsewhere classified: Secondary | ICD-10-CM | POA: Diagnosis not present

## 2017-12-15 DIAGNOSIS — M6281 Muscle weakness (generalized): Secondary | ICD-10-CM | POA: Diagnosis not present

## 2017-12-15 DIAGNOSIS — M5416 Radiculopathy, lumbar region: Secondary | ICD-10-CM | POA: Diagnosis not present

## 2017-12-20 DIAGNOSIS — M6281 Muscle weakness (generalized): Secondary | ICD-10-CM | POA: Diagnosis not present

## 2017-12-20 DIAGNOSIS — M545 Low back pain: Secondary | ICD-10-CM | POA: Diagnosis not present

## 2017-12-20 DIAGNOSIS — R262 Difficulty in walking, not elsewhere classified: Secondary | ICD-10-CM | POA: Diagnosis not present

## 2017-12-20 DIAGNOSIS — M5416 Radiculopathy, lumbar region: Secondary | ICD-10-CM | POA: Diagnosis not present

## 2017-12-22 DIAGNOSIS — M545 Low back pain: Secondary | ICD-10-CM | POA: Diagnosis not present

## 2017-12-22 DIAGNOSIS — M6281 Muscle weakness (generalized): Secondary | ICD-10-CM | POA: Diagnosis not present

## 2017-12-22 DIAGNOSIS — R262 Difficulty in walking, not elsewhere classified: Secondary | ICD-10-CM | POA: Diagnosis not present

## 2017-12-22 DIAGNOSIS — M5416 Radiculopathy, lumbar region: Secondary | ICD-10-CM | POA: Diagnosis not present

## 2017-12-23 DIAGNOSIS — M6281 Muscle weakness (generalized): Secondary | ICD-10-CM | POA: Diagnosis not present

## 2017-12-23 DIAGNOSIS — R262 Difficulty in walking, not elsewhere classified: Secondary | ICD-10-CM | POA: Diagnosis not present

## 2017-12-23 DIAGNOSIS — M545 Low back pain: Secondary | ICD-10-CM | POA: Diagnosis not present

## 2017-12-23 DIAGNOSIS — M5416 Radiculopathy, lumbar region: Secondary | ICD-10-CM | POA: Diagnosis not present

## 2017-12-27 DIAGNOSIS — M6281 Muscle weakness (generalized): Secondary | ICD-10-CM | POA: Diagnosis not present

## 2017-12-27 DIAGNOSIS — R262 Difficulty in walking, not elsewhere classified: Secondary | ICD-10-CM | POA: Diagnosis not present

## 2017-12-27 DIAGNOSIS — M5416 Radiculopathy, lumbar region: Secondary | ICD-10-CM | POA: Diagnosis not present

## 2017-12-27 DIAGNOSIS — M545 Low back pain: Secondary | ICD-10-CM | POA: Diagnosis not present

## 2017-12-29 DIAGNOSIS — R262 Difficulty in walking, not elsewhere classified: Secondary | ICD-10-CM | POA: Diagnosis not present

## 2017-12-29 DIAGNOSIS — M5416 Radiculopathy, lumbar region: Secondary | ICD-10-CM | POA: Diagnosis not present

## 2017-12-29 DIAGNOSIS — M6281 Muscle weakness (generalized): Secondary | ICD-10-CM | POA: Diagnosis not present

## 2017-12-29 DIAGNOSIS — M545 Low back pain: Secondary | ICD-10-CM | POA: Diagnosis not present

## 2017-12-30 DIAGNOSIS — M545 Low back pain: Secondary | ICD-10-CM | POA: Diagnosis not present

## 2017-12-30 DIAGNOSIS — M6281 Muscle weakness (generalized): Secondary | ICD-10-CM | POA: Diagnosis not present

## 2017-12-30 DIAGNOSIS — R262 Difficulty in walking, not elsewhere classified: Secondary | ICD-10-CM | POA: Diagnosis not present

## 2017-12-30 DIAGNOSIS — M5416 Radiculopathy, lumbar region: Secondary | ICD-10-CM | POA: Diagnosis not present

## 2018-01-05 DIAGNOSIS — R262 Difficulty in walking, not elsewhere classified: Secondary | ICD-10-CM | POA: Diagnosis not present

## 2018-01-05 DIAGNOSIS — R69 Illness, unspecified: Secondary | ICD-10-CM | POA: Diagnosis not present

## 2018-01-05 DIAGNOSIS — M545 Low back pain: Secondary | ICD-10-CM | POA: Diagnosis not present

## 2018-01-05 DIAGNOSIS — M5416 Radiculopathy, lumbar region: Secondary | ICD-10-CM | POA: Diagnosis not present

## 2018-01-05 DIAGNOSIS — M6281 Muscle weakness (generalized): Secondary | ICD-10-CM | POA: Diagnosis not present

## 2018-01-10 DIAGNOSIS — J3081 Allergic rhinitis due to animal (cat) (dog) hair and dander: Secondary | ICD-10-CM | POA: Diagnosis not present

## 2018-01-10 DIAGNOSIS — J3089 Other allergic rhinitis: Secondary | ICD-10-CM | POA: Diagnosis not present

## 2018-01-30 DIAGNOSIS — M545 Low back pain: Secondary | ICD-10-CM | POA: Diagnosis not present

## 2018-01-30 DIAGNOSIS — Z6827 Body mass index (BMI) 27.0-27.9, adult: Secondary | ICD-10-CM | POA: Diagnosis not present

## 2018-01-30 DIAGNOSIS — M5416 Radiculopathy, lumbar region: Secondary | ICD-10-CM | POA: Diagnosis not present

## 2018-01-30 DIAGNOSIS — M48061 Spinal stenosis, lumbar region without neurogenic claudication: Secondary | ICD-10-CM | POA: Diagnosis not present

## 2018-01-30 DIAGNOSIS — I10 Essential (primary) hypertension: Secondary | ICD-10-CM | POA: Diagnosis not present

## 2018-01-30 DIAGNOSIS — M4126 Other idiopathic scoliosis, lumbar region: Secondary | ICD-10-CM | POA: Diagnosis not present

## 2018-01-30 DIAGNOSIS — M4316 Spondylolisthesis, lumbar region: Secondary | ICD-10-CM | POA: Diagnosis not present

## 2018-01-30 DIAGNOSIS — M5126 Other intervertebral disc displacement, lumbar region: Secondary | ICD-10-CM | POA: Diagnosis not present

## 2018-02-14 DIAGNOSIS — J3089 Other allergic rhinitis: Secondary | ICD-10-CM | POA: Diagnosis not present

## 2018-02-14 DIAGNOSIS — J3081 Allergic rhinitis due to animal (cat) (dog) hair and dander: Secondary | ICD-10-CM | POA: Diagnosis not present

## 2018-02-18 IMAGING — DX DG ABDOMEN ACUTE W/ 1V CHEST
3 series · 3 of 3 positions shown · non-contrast
Comparison: Chest 11/27/2012

CLINICAL DATA: Chest and abdomen pain.  Recent colonoscopy.

EXAM:
DG ABDOMEN ACUTE W/ 1V CHEST

[chest pa]
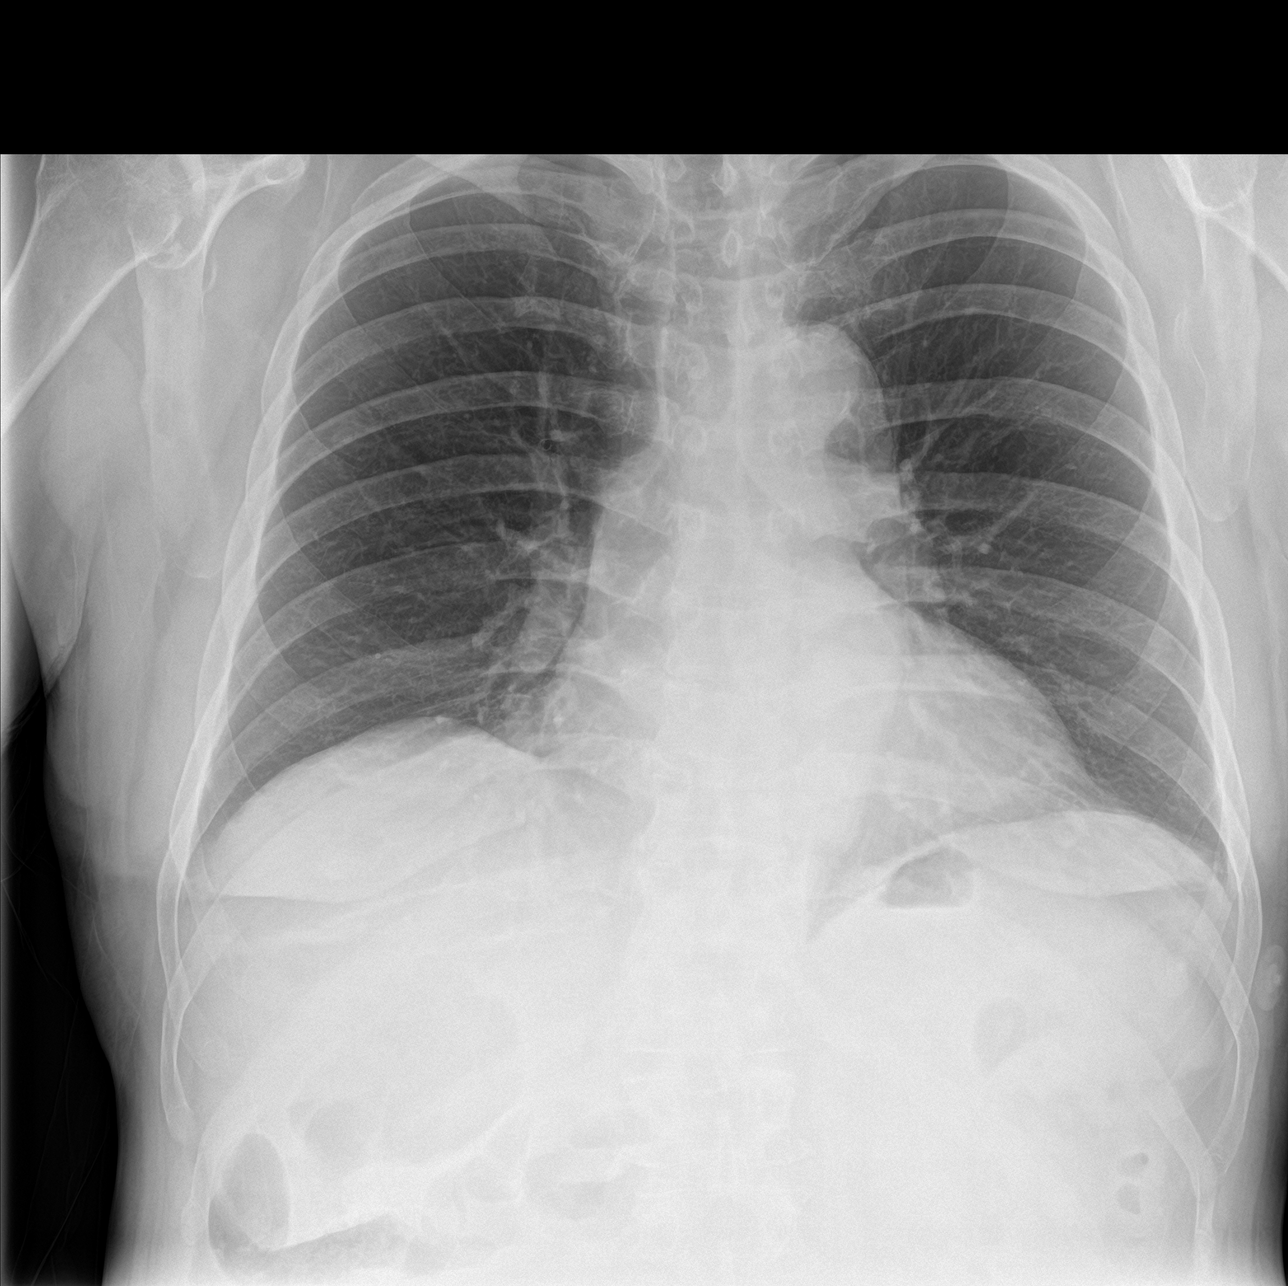

[abdomen erect]
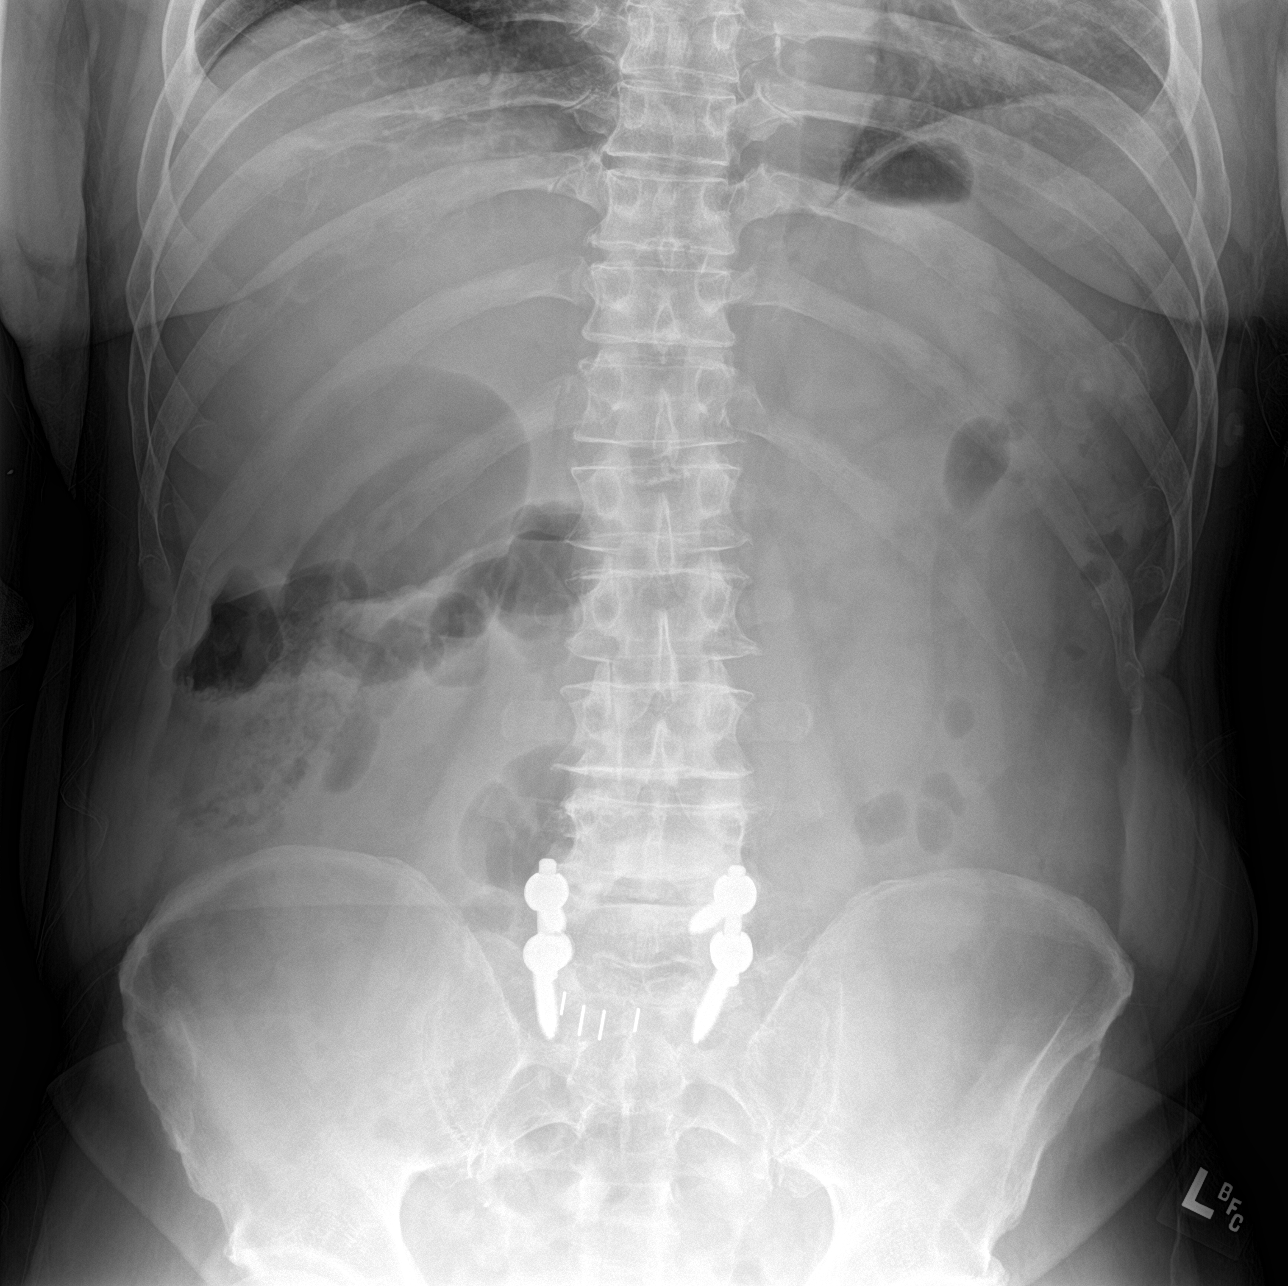

[abdomen supine]
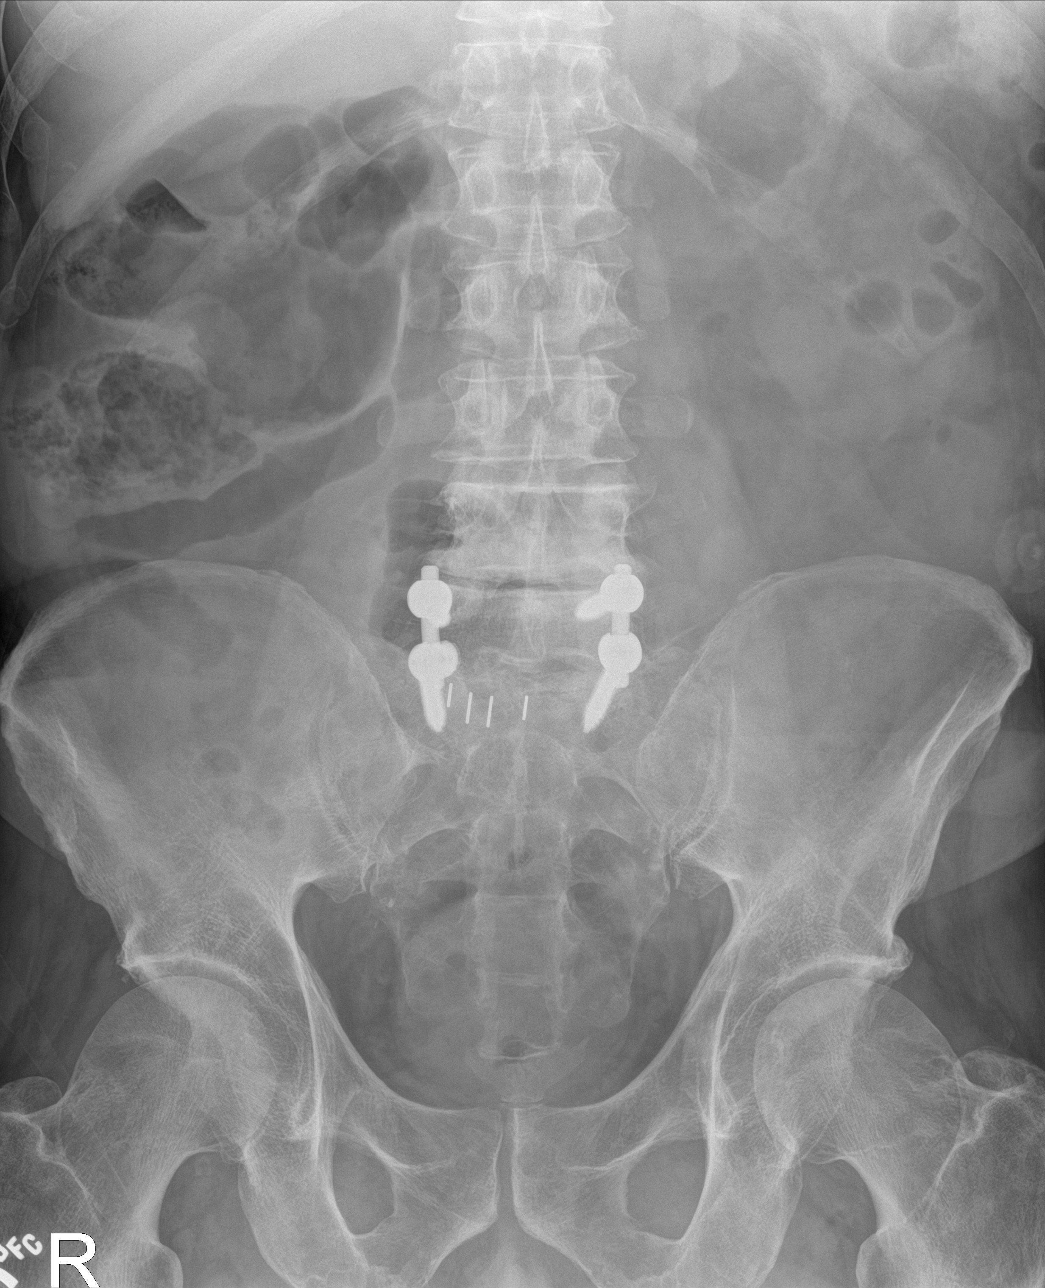

[3 of 3 positions shown; findings below may reference images not displayed]

FINDINGS: Normal heart size and pulmonary vascularity. No focal airspace
disease or consolidation in the lungs. No blunting of costophrenic
angles. No pneumothorax. Mediastinal contours appear intact.
Calcified and tortuous aorta. Postoperative changes and degenerative
changes in the right shoulder.

Residual gas and stool in the colon. No small or large bowel
distention. No free intra-abdominal air. No abnormal air-fluid
levels. Postoperative changes in the lumbar spine. No radiopaque
stones. Degenerative changes in the spine and hips.
IMPRESSION: No evidence of active pulmonary disease. Nonobstructive bowel gas
pattern. No free air.

## 2018-02-21 DIAGNOSIS — J3089 Other allergic rhinitis: Secondary | ICD-10-CM | POA: Diagnosis not present

## 2018-02-21 DIAGNOSIS — J3081 Allergic rhinitis due to animal (cat) (dog) hair and dander: Secondary | ICD-10-CM | POA: Diagnosis not present

## 2018-03-02 DIAGNOSIS — J3089 Other allergic rhinitis: Secondary | ICD-10-CM | POA: Diagnosis not present

## 2018-03-02 DIAGNOSIS — J3081 Allergic rhinitis due to animal (cat) (dog) hair and dander: Secondary | ICD-10-CM | POA: Diagnosis not present

## 2018-03-07 DIAGNOSIS — J3089 Other allergic rhinitis: Secondary | ICD-10-CM | POA: Diagnosis not present

## 2018-03-07 DIAGNOSIS — J3081 Allergic rhinitis due to animal (cat) (dog) hair and dander: Secondary | ICD-10-CM | POA: Diagnosis not present

## 2018-03-13 DIAGNOSIS — R972 Elevated prostate specific antigen [PSA]: Secondary | ICD-10-CM | POA: Diagnosis not present

## 2018-03-15 DIAGNOSIS — R972 Elevated prostate specific antigen [PSA]: Secondary | ICD-10-CM | POA: Diagnosis not present

## 2018-03-15 DIAGNOSIS — N529 Male erectile dysfunction, unspecified: Secondary | ICD-10-CM | POA: Diagnosis not present

## 2018-03-15 DIAGNOSIS — N5314 Retrograde ejaculation: Secondary | ICD-10-CM | POA: Diagnosis not present

## 2018-03-17 DIAGNOSIS — J3089 Other allergic rhinitis: Secondary | ICD-10-CM | POA: Diagnosis not present

## 2018-03-17 DIAGNOSIS — J3081 Allergic rhinitis due to animal (cat) (dog) hair and dander: Secondary | ICD-10-CM | POA: Diagnosis not present

## 2018-04-24 DIAGNOSIS — M5126 Other intervertebral disc displacement, lumbar region: Secondary | ICD-10-CM | POA: Diagnosis not present

## 2018-04-24 DIAGNOSIS — M545 Low back pain: Secondary | ICD-10-CM | POA: Diagnosis not present

## 2018-04-24 DIAGNOSIS — M4126 Other idiopathic scoliosis, lumbar region: Secondary | ICD-10-CM | POA: Diagnosis not present

## 2018-04-24 DIAGNOSIS — M5416 Radiculopathy, lumbar region: Secondary | ICD-10-CM | POA: Diagnosis not present

## 2018-04-24 DIAGNOSIS — M4316 Spondylolisthesis, lumbar region: Secondary | ICD-10-CM | POA: Diagnosis not present

## 2018-04-27 DIAGNOSIS — M545 Low back pain: Secondary | ICD-10-CM | POA: Diagnosis not present

## 2018-04-27 DIAGNOSIS — M7062 Trochanteric bursitis, left hip: Secondary | ICD-10-CM | POA: Diagnosis not present

## 2018-04-27 DIAGNOSIS — G8929 Other chronic pain: Secondary | ICD-10-CM | POA: Diagnosis not present

## 2018-05-08 DIAGNOSIS — J3089 Other allergic rhinitis: Secondary | ICD-10-CM | POA: Diagnosis not present

## 2018-05-08 DIAGNOSIS — J3081 Allergic rhinitis due to animal (cat) (dog) hair and dander: Secondary | ICD-10-CM | POA: Diagnosis not present

## 2018-05-12 DIAGNOSIS — R262 Difficulty in walking, not elsewhere classified: Secondary | ICD-10-CM | POA: Diagnosis not present

## 2018-05-12 DIAGNOSIS — M545 Low back pain: Secondary | ICD-10-CM | POA: Diagnosis not present

## 2018-05-12 DIAGNOSIS — M6281 Muscle weakness (generalized): Secondary | ICD-10-CM | POA: Diagnosis not present

## 2018-05-12 DIAGNOSIS — M5416 Radiculopathy, lumbar region: Secondary | ICD-10-CM | POA: Diagnosis not present

## 2018-05-15 DIAGNOSIS — M545 Low back pain: Secondary | ICD-10-CM | POA: Diagnosis not present

## 2018-05-15 DIAGNOSIS — M4316 Spondylolisthesis, lumbar region: Secondary | ICD-10-CM | POA: Diagnosis not present

## 2018-05-15 DIAGNOSIS — M5126 Other intervertebral disc displacement, lumbar region: Secondary | ICD-10-CM | POA: Diagnosis not present

## 2018-05-15 DIAGNOSIS — M7062 Trochanteric bursitis, left hip: Secondary | ICD-10-CM | POA: Diagnosis not present

## 2018-05-15 DIAGNOSIS — M4126 Other idiopathic scoliosis, lumbar region: Secondary | ICD-10-CM | POA: Diagnosis not present

## 2018-05-15 DIAGNOSIS — M5416 Radiculopathy, lumbar region: Secondary | ICD-10-CM | POA: Diagnosis not present

## 2018-05-16 DIAGNOSIS — M6281 Muscle weakness (generalized): Secondary | ICD-10-CM | POA: Diagnosis not present

## 2018-05-16 DIAGNOSIS — M25552 Pain in left hip: Secondary | ICD-10-CM | POA: Diagnosis not present

## 2018-05-16 DIAGNOSIS — M545 Low back pain: Secondary | ICD-10-CM | POA: Diagnosis not present

## 2018-05-16 DIAGNOSIS — M5416 Radiculopathy, lumbar region: Secondary | ICD-10-CM | POA: Diagnosis not present

## 2018-05-18 DIAGNOSIS — M6281 Muscle weakness (generalized): Secondary | ICD-10-CM | POA: Diagnosis not present

## 2018-05-18 DIAGNOSIS — M25552 Pain in left hip: Secondary | ICD-10-CM | POA: Diagnosis not present

## 2018-05-18 DIAGNOSIS — M5416 Radiculopathy, lumbar region: Secondary | ICD-10-CM | POA: Diagnosis not present

## 2018-05-18 DIAGNOSIS — M545 Low back pain: Secondary | ICD-10-CM | POA: Diagnosis not present

## 2018-05-19 DIAGNOSIS — M25552 Pain in left hip: Secondary | ICD-10-CM | POA: Diagnosis not present

## 2018-05-19 DIAGNOSIS — M6281 Muscle weakness (generalized): Secondary | ICD-10-CM | POA: Diagnosis not present

## 2018-05-19 DIAGNOSIS — M5416 Radiculopathy, lumbar region: Secondary | ICD-10-CM | POA: Diagnosis not present

## 2018-05-19 DIAGNOSIS — M545 Low back pain: Secondary | ICD-10-CM | POA: Diagnosis not present

## 2018-05-22 DIAGNOSIS — M7062 Trochanteric bursitis, left hip: Secondary | ICD-10-CM | POA: Diagnosis not present

## 2018-05-22 DIAGNOSIS — M5416 Radiculopathy, lumbar region: Secondary | ICD-10-CM | POA: Diagnosis not present

## 2018-05-23 DIAGNOSIS — M545 Low back pain: Secondary | ICD-10-CM | POA: Diagnosis not present

## 2018-05-23 DIAGNOSIS — M25552 Pain in left hip: Secondary | ICD-10-CM | POA: Diagnosis not present

## 2018-05-23 DIAGNOSIS — M5416 Radiculopathy, lumbar region: Secondary | ICD-10-CM | POA: Diagnosis not present

## 2018-05-23 DIAGNOSIS — M6281 Muscle weakness (generalized): Secondary | ICD-10-CM | POA: Diagnosis not present

## 2018-05-25 DIAGNOSIS — M5416 Radiculopathy, lumbar region: Secondary | ICD-10-CM | POA: Diagnosis not present

## 2018-05-25 DIAGNOSIS — M25552 Pain in left hip: Secondary | ICD-10-CM | POA: Diagnosis not present

## 2018-05-25 DIAGNOSIS — M6281 Muscle weakness (generalized): Secondary | ICD-10-CM | POA: Diagnosis not present

## 2018-05-25 DIAGNOSIS — M545 Low back pain: Secondary | ICD-10-CM | POA: Diagnosis not present

## 2018-05-26 DIAGNOSIS — M6281 Muscle weakness (generalized): Secondary | ICD-10-CM | POA: Diagnosis not present

## 2018-05-26 DIAGNOSIS — M5416 Radiculopathy, lumbar region: Secondary | ICD-10-CM | POA: Diagnosis not present

## 2018-05-26 DIAGNOSIS — M25552 Pain in left hip: Secondary | ICD-10-CM | POA: Diagnosis not present

## 2018-05-26 DIAGNOSIS — M545 Low back pain: Secondary | ICD-10-CM | POA: Diagnosis not present

## 2018-05-30 DIAGNOSIS — M545 Low back pain: Secondary | ICD-10-CM | POA: Diagnosis not present

## 2018-05-30 DIAGNOSIS — M5416 Radiculopathy, lumbar region: Secondary | ICD-10-CM | POA: Diagnosis not present

## 2018-05-30 DIAGNOSIS — M6281 Muscle weakness (generalized): Secondary | ICD-10-CM | POA: Diagnosis not present

## 2018-05-30 DIAGNOSIS — M25552 Pain in left hip: Secondary | ICD-10-CM | POA: Diagnosis not present

## 2018-06-01 DIAGNOSIS — M5416 Radiculopathy, lumbar region: Secondary | ICD-10-CM | POA: Diagnosis not present

## 2018-06-01 DIAGNOSIS — M545 Low back pain: Secondary | ICD-10-CM | POA: Diagnosis not present

## 2018-06-01 DIAGNOSIS — M6281 Muscle weakness (generalized): Secondary | ICD-10-CM | POA: Diagnosis not present

## 2018-06-01 DIAGNOSIS — M25552 Pain in left hip: Secondary | ICD-10-CM | POA: Diagnosis not present

## 2018-06-02 DIAGNOSIS — M25552 Pain in left hip: Secondary | ICD-10-CM | POA: Diagnosis not present

## 2018-06-02 DIAGNOSIS — M6281 Muscle weakness (generalized): Secondary | ICD-10-CM | POA: Diagnosis not present

## 2018-06-02 DIAGNOSIS — M5416 Radiculopathy, lumbar region: Secondary | ICD-10-CM | POA: Diagnosis not present

## 2018-06-02 DIAGNOSIS — M545 Low back pain: Secondary | ICD-10-CM | POA: Diagnosis not present

## 2018-06-06 DIAGNOSIS — M6281 Muscle weakness (generalized): Secondary | ICD-10-CM | POA: Diagnosis not present

## 2018-06-06 DIAGNOSIS — M545 Low back pain: Secondary | ICD-10-CM | POA: Diagnosis not present

## 2018-06-06 DIAGNOSIS — M25552 Pain in left hip: Secondary | ICD-10-CM | POA: Diagnosis not present

## 2018-06-06 DIAGNOSIS — M5416 Radiculopathy, lumbar region: Secondary | ICD-10-CM | POA: Diagnosis not present

## 2018-06-07 DIAGNOSIS — M5416 Radiculopathy, lumbar region: Secondary | ICD-10-CM | POA: Diagnosis not present

## 2018-06-08 DIAGNOSIS — M545 Low back pain: Secondary | ICD-10-CM | POA: Diagnosis not present

## 2018-06-08 DIAGNOSIS — M25552 Pain in left hip: Secondary | ICD-10-CM | POA: Diagnosis not present

## 2018-06-08 DIAGNOSIS — M5416 Radiculopathy, lumbar region: Secondary | ICD-10-CM | POA: Diagnosis not present

## 2018-06-08 DIAGNOSIS — M6281 Muscle weakness (generalized): Secondary | ICD-10-CM | POA: Diagnosis not present

## 2018-06-09 DIAGNOSIS — M5416 Radiculopathy, lumbar region: Secondary | ICD-10-CM | POA: Diagnosis not present

## 2018-06-09 DIAGNOSIS — M545 Low back pain: Secondary | ICD-10-CM | POA: Diagnosis not present

## 2018-06-09 DIAGNOSIS — M6281 Muscle weakness (generalized): Secondary | ICD-10-CM | POA: Diagnosis not present

## 2018-06-09 DIAGNOSIS — M25552 Pain in left hip: Secondary | ICD-10-CM | POA: Diagnosis not present

## 2018-06-14 DIAGNOSIS — M5416 Radiculopathy, lumbar region: Secondary | ICD-10-CM | POA: Diagnosis not present

## 2018-06-14 DIAGNOSIS — M4316 Spondylolisthesis, lumbar region: Secondary | ICD-10-CM | POA: Diagnosis not present

## 2018-06-14 DIAGNOSIS — M545 Low back pain: Secondary | ICD-10-CM | POA: Diagnosis not present

## 2018-06-14 DIAGNOSIS — I10 Essential (primary) hypertension: Secondary | ICD-10-CM | POA: Diagnosis not present

## 2018-06-14 DIAGNOSIS — Z6826 Body mass index (BMI) 26.0-26.9, adult: Secondary | ICD-10-CM | POA: Diagnosis not present

## 2018-06-14 DIAGNOSIS — M5126 Other intervertebral disc displacement, lumbar region: Secondary | ICD-10-CM | POA: Diagnosis not present

## 2018-06-14 DIAGNOSIS — M4126 Other idiopathic scoliosis, lumbar region: Secondary | ICD-10-CM | POA: Diagnosis not present

## 2018-06-15 DIAGNOSIS — M6281 Muscle weakness (generalized): Secondary | ICD-10-CM | POA: Diagnosis not present

## 2018-06-15 DIAGNOSIS — M5416 Radiculopathy, lumbar region: Secondary | ICD-10-CM | POA: Diagnosis not present

## 2018-06-15 DIAGNOSIS — M25552 Pain in left hip: Secondary | ICD-10-CM | POA: Diagnosis not present

## 2018-06-15 DIAGNOSIS — M545 Low back pain: Secondary | ICD-10-CM | POA: Diagnosis not present

## 2018-06-20 DIAGNOSIS — M545 Low back pain: Secondary | ICD-10-CM | POA: Diagnosis not present

## 2018-06-20 DIAGNOSIS — M5126 Other intervertebral disc displacement, lumbar region: Secondary | ICD-10-CM | POA: Diagnosis not present

## 2018-06-20 DIAGNOSIS — M6281 Muscle weakness (generalized): Secondary | ICD-10-CM | POA: Diagnosis not present

## 2018-06-20 DIAGNOSIS — M5416 Radiculopathy, lumbar region: Secondary | ICD-10-CM | POA: Diagnosis not present

## 2018-06-20 DIAGNOSIS — M25552 Pain in left hip: Secondary | ICD-10-CM | POA: Diagnosis not present

## 2018-06-28 DIAGNOSIS — M48061 Spinal stenosis, lumbar region without neurogenic claudication: Secondary | ICD-10-CM | POA: Diagnosis not present

## 2018-06-28 DIAGNOSIS — M5416 Radiculopathy, lumbar region: Secondary | ICD-10-CM | POA: Diagnosis not present

## 2018-06-28 DIAGNOSIS — M5117 Intervertebral disc disorders with radiculopathy, lumbosacral region: Secondary | ICD-10-CM | POA: Diagnosis not present

## 2018-06-28 DIAGNOSIS — M5126 Other intervertebral disc displacement, lumbar region: Secondary | ICD-10-CM | POA: Diagnosis not present

## 2018-06-28 DIAGNOSIS — M545 Low back pain: Secondary | ICD-10-CM | POA: Diagnosis not present

## 2018-06-28 DIAGNOSIS — R030 Elevated blood-pressure reading, without diagnosis of hypertension: Secondary | ICD-10-CM | POA: Diagnosis not present

## 2018-06-28 DIAGNOSIS — Z6826 Body mass index (BMI) 26.0-26.9, adult: Secondary | ICD-10-CM | POA: Diagnosis not present

## 2018-06-28 DIAGNOSIS — M4126 Other idiopathic scoliosis, lumbar region: Secondary | ICD-10-CM | POA: Diagnosis not present

## 2018-06-28 DIAGNOSIS — M4316 Spondylolisthesis, lumbar region: Secondary | ICD-10-CM | POA: Diagnosis not present

## 2018-07-26 DIAGNOSIS — M5416 Radiculopathy, lumbar region: Secondary | ICD-10-CM | POA: Diagnosis not present

## 2018-07-26 DIAGNOSIS — M4800 Spinal stenosis, site unspecified: Secondary | ICD-10-CM | POA: Diagnosis not present

## 2018-08-04 DIAGNOSIS — M5116 Intervertebral disc disorders with radiculopathy, lumbar region: Secondary | ICD-10-CM | POA: Diagnosis not present

## 2018-08-04 DIAGNOSIS — M4800 Spinal stenosis, site unspecified: Secondary | ICD-10-CM | POA: Diagnosis not present

## 2018-08-17 DIAGNOSIS — M4800 Spinal stenosis, site unspecified: Secondary | ICD-10-CM | POA: Diagnosis not present

## 2018-08-17 DIAGNOSIS — M5116 Intervertebral disc disorders with radiculopathy, lumbar region: Secondary | ICD-10-CM | POA: Diagnosis not present

## 2018-08-17 DIAGNOSIS — M47816 Spondylosis without myelopathy or radiculopathy, lumbar region: Secondary | ICD-10-CM | POA: Diagnosis not present

## 2018-08-17 DIAGNOSIS — M5416 Radiculopathy, lumbar region: Secondary | ICD-10-CM | POA: Diagnosis not present

## 2018-08-22 DIAGNOSIS — M47816 Spondylosis without myelopathy or radiculopathy, lumbar region: Secondary | ICD-10-CM | POA: Diagnosis not present

## 2018-08-24 DIAGNOSIS — Z961 Presence of intraocular lens: Secondary | ICD-10-CM | POA: Diagnosis not present

## 2018-08-24 DIAGNOSIS — H35033 Hypertensive retinopathy, bilateral: Secondary | ICD-10-CM | POA: Diagnosis not present

## 2018-08-24 DIAGNOSIS — H26492 Other secondary cataract, left eye: Secondary | ICD-10-CM | POA: Diagnosis not present

## 2018-08-24 DIAGNOSIS — H04123 Dry eye syndrome of bilateral lacrimal glands: Secondary | ICD-10-CM | POA: Diagnosis not present

## 2018-09-01 DIAGNOSIS — M545 Low back pain: Secondary | ICD-10-CM | POA: Diagnosis not present

## 2018-09-01 DIAGNOSIS — R6889 Other general symptoms and signs: Secondary | ICD-10-CM | POA: Diagnosis not present

## 2018-09-01 DIAGNOSIS — E7800 Pure hypercholesterolemia, unspecified: Secondary | ICD-10-CM | POA: Diagnosis not present

## 2018-09-01 DIAGNOSIS — G8929 Other chronic pain: Secondary | ICD-10-CM | POA: Diagnosis not present

## 2018-09-01 DIAGNOSIS — Z1331 Encounter for screening for depression: Secondary | ICD-10-CM | POA: Diagnosis not present

## 2018-09-01 DIAGNOSIS — Z9109 Other allergy status, other than to drugs and biological substances: Secondary | ICD-10-CM | POA: Diagnosis not present

## 2018-09-01 DIAGNOSIS — I10 Essential (primary) hypertension: Secondary | ICD-10-CM | POA: Diagnosis not present

## 2018-09-01 DIAGNOSIS — Z0000 Encounter for general adult medical examination without abnormal findings: Secondary | ICD-10-CM | POA: Diagnosis not present

## 2018-09-01 DIAGNOSIS — R233 Spontaneous ecchymoses: Secondary | ICD-10-CM | POA: Diagnosis not present

## 2018-09-01 DIAGNOSIS — N401 Enlarged prostate with lower urinary tract symptoms: Secondary | ICD-10-CM | POA: Diagnosis not present

## 2018-09-05 DIAGNOSIS — Z125 Encounter for screening for malignant neoplasm of prostate: Secondary | ICD-10-CM | POA: Diagnosis not present

## 2018-09-05 DIAGNOSIS — E7800 Pure hypercholesterolemia, unspecified: Secondary | ICD-10-CM | POA: Diagnosis not present

## 2018-09-05 DIAGNOSIS — I10 Essential (primary) hypertension: Secondary | ICD-10-CM | POA: Diagnosis not present

## 2018-09-29 DIAGNOSIS — M4326 Fusion of spine, lumbar region: Secondary | ICD-10-CM | POA: Diagnosis not present

## 2018-09-29 DIAGNOSIS — M5416 Radiculopathy, lumbar region: Secondary | ICD-10-CM | POA: Diagnosis not present

## 2018-09-29 DIAGNOSIS — M9973 Connective tissue and disc stenosis of intervertebral foramina of lumbar region: Secondary | ICD-10-CM | POA: Diagnosis not present

## 2018-10-12 DIAGNOSIS — G894 Chronic pain syndrome: Secondary | ICD-10-CM | POA: Diagnosis not present

## 2018-10-12 DIAGNOSIS — Z79899 Other long term (current) drug therapy: Secondary | ICD-10-CM | POA: Diagnosis not present

## 2018-10-12 DIAGNOSIS — M5416 Radiculopathy, lumbar region: Secondary | ICD-10-CM | POA: Diagnosis not present

## 2018-10-12 DIAGNOSIS — Z5181 Encounter for therapeutic drug level monitoring: Secondary | ICD-10-CM | POA: Diagnosis not present

## 2018-10-12 DIAGNOSIS — Z79891 Long term (current) use of opiate analgesic: Secondary | ICD-10-CM | POA: Diagnosis not present

## 2018-10-12 DIAGNOSIS — M961 Postlaminectomy syndrome, not elsewhere classified: Secondary | ICD-10-CM | POA: Diagnosis not present

## 2018-10-24 DIAGNOSIS — R69 Illness, unspecified: Secondary | ICD-10-CM | POA: Diagnosis not present

## 2018-11-10 DIAGNOSIS — M4807 Spinal stenosis, lumbosacral region: Secondary | ICD-10-CM | POA: Diagnosis not present

## 2018-11-10 DIAGNOSIS — M4316 Spondylolisthesis, lumbar region: Secondary | ICD-10-CM | POA: Diagnosis not present

## 2018-11-10 DIAGNOSIS — M5136 Other intervertebral disc degeneration, lumbar region: Secondary | ICD-10-CM | POA: Diagnosis not present

## 2018-11-10 DIAGNOSIS — M5416 Radiculopathy, lumbar region: Secondary | ICD-10-CM | POA: Diagnosis not present

## 2018-11-24 DIAGNOSIS — M4807 Spinal stenosis, lumbosacral region: Secondary | ICD-10-CM | POA: Diagnosis not present

## 2018-11-24 DIAGNOSIS — Z981 Arthrodesis status: Secondary | ICD-10-CM | POA: Diagnosis not present

## 2018-12-05 DIAGNOSIS — M5416 Radiculopathy, lumbar region: Secondary | ICD-10-CM | POA: Diagnosis not present

## 2018-12-05 DIAGNOSIS — Z5181 Encounter for therapeutic drug level monitoring: Secondary | ICD-10-CM | POA: Diagnosis not present

## 2018-12-05 DIAGNOSIS — M961 Postlaminectomy syndrome, not elsewhere classified: Secondary | ICD-10-CM | POA: Diagnosis not present

## 2018-12-05 DIAGNOSIS — G894 Chronic pain syndrome: Secondary | ICD-10-CM | POA: Diagnosis not present

## 2018-12-05 DIAGNOSIS — Z79899 Other long term (current) drug therapy: Secondary | ICD-10-CM | POA: Diagnosis not present

## 2018-12-05 DIAGNOSIS — Z79891 Long term (current) use of opiate analgesic: Secondary | ICD-10-CM | POA: Diagnosis not present

## 2018-12-19 DIAGNOSIS — L240 Irritant contact dermatitis due to detergents: Secondary | ICD-10-CM | POA: Diagnosis not present

## 2018-12-19 DIAGNOSIS — D692 Other nonthrombocytopenic purpura: Secondary | ICD-10-CM | POA: Diagnosis not present

## 2018-12-19 DIAGNOSIS — L905 Scar conditions and fibrosis of skin: Secondary | ICD-10-CM | POA: Diagnosis not present

## 2018-12-19 DIAGNOSIS — L814 Other melanin hyperpigmentation: Secondary | ICD-10-CM | POA: Diagnosis not present

## 2019-05-28 ENCOUNTER — Emergency Department: Admission: EM | Admit: 2019-05-28 | Discharge: 2019-05-28 | Discharge status: Absent without leave | Payer: Medicare HMO

## 2019-06-07 IMAGING — CT CT BIOPSY
4 of 9 series · 8 of 32 positions shown, 11 images · non-contrast
Comparison: none

CLINICAL DATA: Bilateral low back pain. The patient reports no
relief from prior fluoroscopic guided facet injections.

[Series 2: needle -guided injection · axial · 0.64mm/px · z∈[+679,+701]mm · 2 of 35 slices shown, 5 images (1 of 4)]
[im 12/35  soft-tissue]
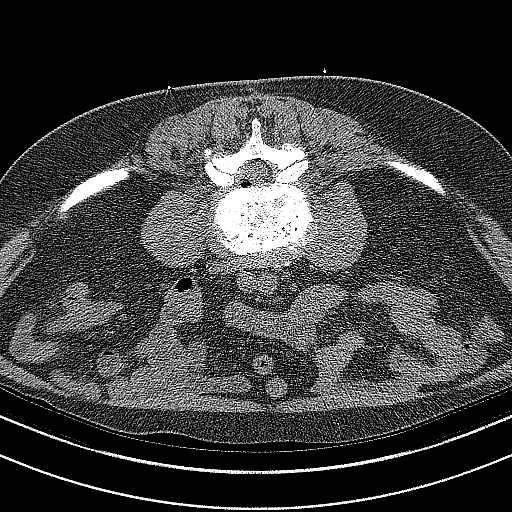
[im 12/35  lung]
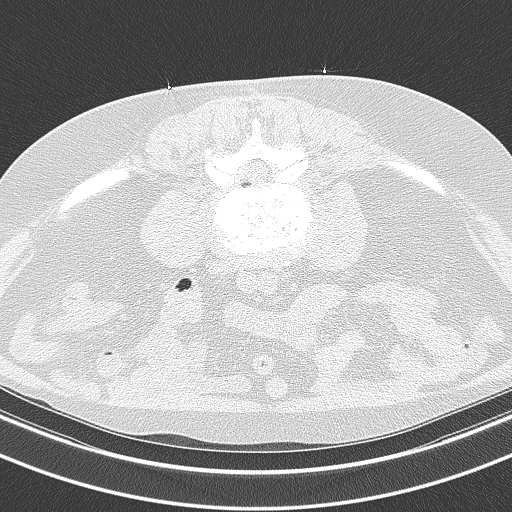
[im 12/35  bone]
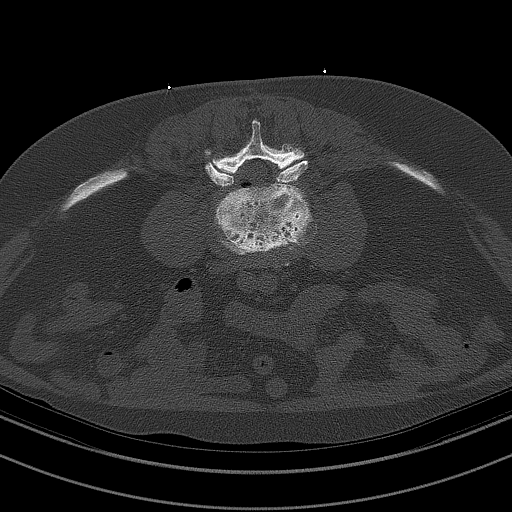
[im 23/35  soft-tissue]
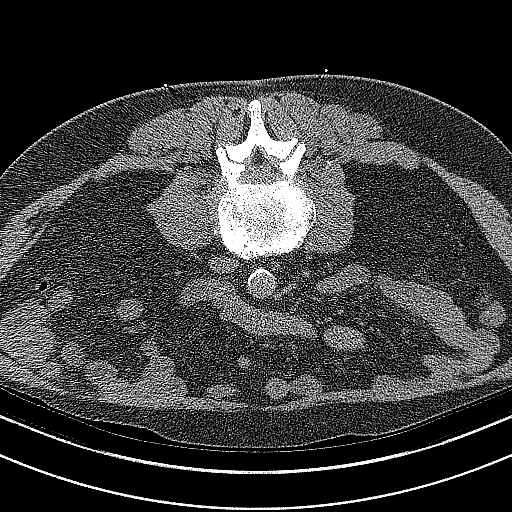
[im 23/35  lung]
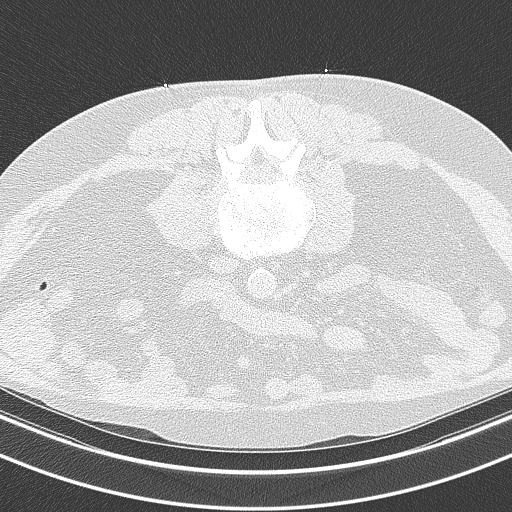

[Series 3: needle -guided injection · axial · 0.64mm/px · z∈[+676,+696]mm · 2 of 31 slices shown (2 of 4)]
[im 11/31  soft-tissue]
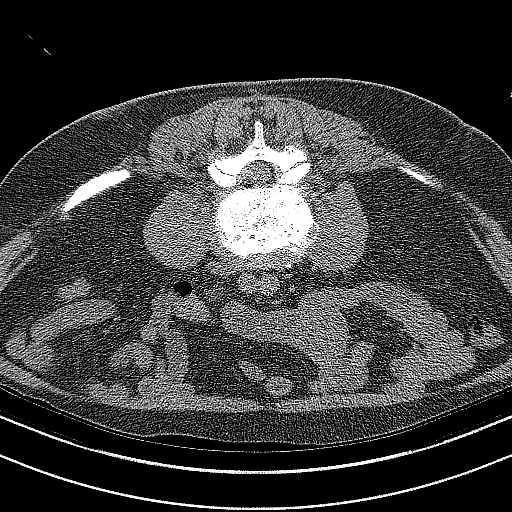
[im 21/31  soft-tissue]
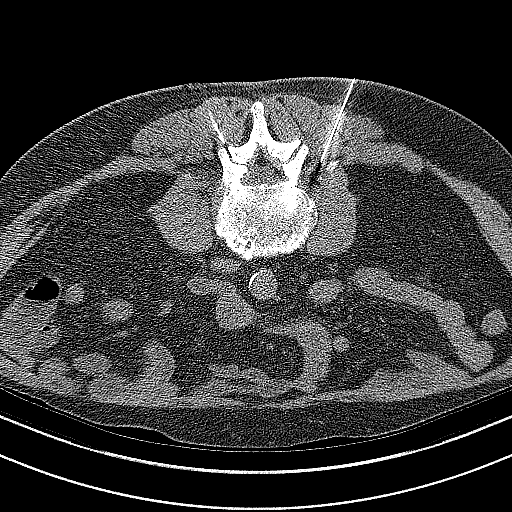

[Series 4: needle -guided injection · axial · 0.64mm/px · z∈[+676,+696]mm · 2 of 31 slices shown (3 of 4)]
[im 11/31  soft-tissue]
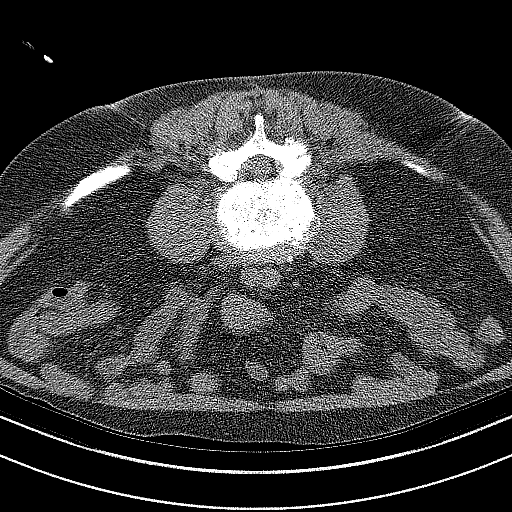
[im 21/31  soft-tissue]
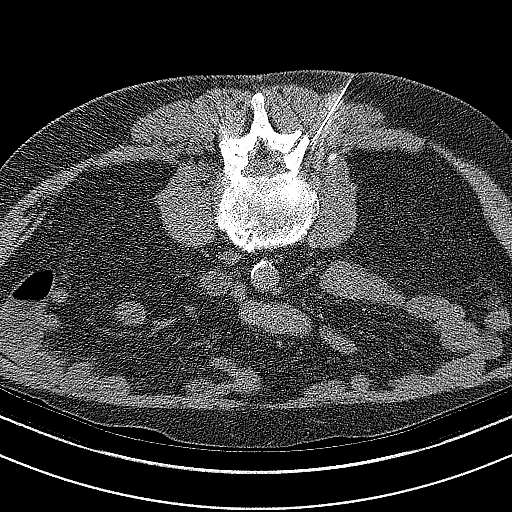

[Series 5: needle -guided injection · axial · 0.64mm/px · z∈[+676,+696]mm · 2 of 31 slices shown (4 of 4)]
[im 11/31  soft-tissue]
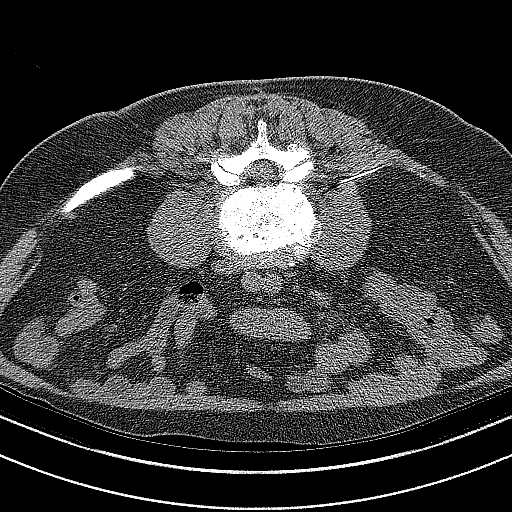
[im 21/31  soft-tissue]
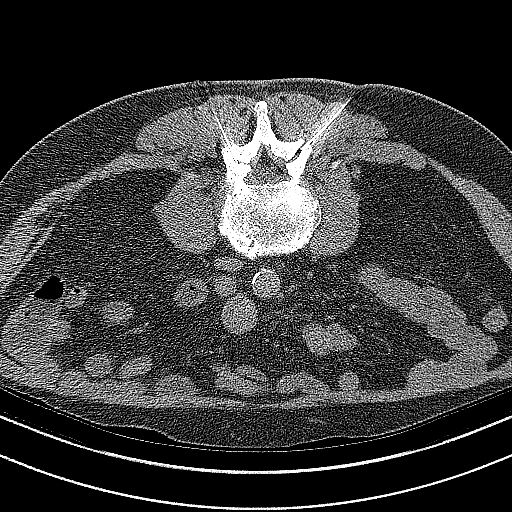

[8 of 32 positions shown; findings below may reference images not displayed]

EXAM:
Bilateral CT-guided L3-4 and L4-5 facet steroid injections.

PROCEDURE:
After a thorough discussion of risks and benefits of the procedure,
including bleeding, infection, injury to nerves, blood vessels, and
adjacent structures, verbal and written consent was obtained.
Specific risks of the procedure included
nondiagnostic/nontherapeutic injection and non target injection. The
patient was placed prone on the CT table and localization was
performed over the sacrum. Target sites were marked using CT
guidance. The skin was prepped and draped in the usual sterile
fashion using Betadine soap.

After local anesthesia with 1% lidocaine without epinephrine and
subsequent deep anesthesia, a 22 spinal needle was advanced to see
L3-4 and L4-5 facet joints bilaterally under intermittent CT
guidance.

Once the needles were in satisfactory position, representative image
was captured with the needle demonstrated in the bilateral L3-4 and
L4-5 facet injections. Injection of 0.5 mL Isovue M 200 confirmed
location with the each joint.

Subsequently, 30 mg Depo-Medrol and 0.5 mL 1% lidocaine was injected
into each facet joint at L3-4 and L4-5 bilaterally.

Needles were removed and a sterile dressing applied.

No complications were observed.
IMPRESSION: Successful CT-guided bilateral L3-4 and L4-5 facet steroid
injections
